# Patient Record
Sex: Female | Born: 2000 | Race: White | Hispanic: No | Marital: Single | Smoking: Never smoker
Health system: Southern US, Community
[De-identification: ages and names within clinical notes are randomized; demographics above are authoritative.]

## PROBLEM LIST (undated history)

## (undated) DIAGNOSIS — R55 Syncope and collapse: Secondary | ICD-10-CM

## (undated) DIAGNOSIS — F410 Panic disorder [episodic paroxysmal anxiety] without agoraphobia: Secondary | ICD-10-CM

## (undated) DIAGNOSIS — J939 Pneumothorax, unspecified: Secondary | ICD-10-CM

## (undated) DIAGNOSIS — K529 Noninfective gastroenteritis and colitis, unspecified: Secondary | ICD-10-CM

## (undated) DIAGNOSIS — S060XAA Concussion with loss of consciousness status unknown, initial encounter: Secondary | ICD-10-CM

## (undated) DIAGNOSIS — S060X9A Concussion with loss of consciousness of unspecified duration, initial encounter: Secondary | ICD-10-CM

## (undated) HISTORY — PX: ESOPHAGOGASTRODUODENOSCOPY: SHX1529

---
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
## 2018-02-07 ENCOUNTER — Encounter: Payer: Self-pay | Admitting: Emergency Medicine

## 2018-02-07 ENCOUNTER — Ambulatory Visit
Admission: EM | Admit: 2018-02-07 | Discharge: 2018-02-07 | Disposition: A | Discharge status: Home / Self Care | Payer: Self-pay | Attending: Emergency Medicine | Admitting: Emergency Medicine

## 2018-02-07 ENCOUNTER — Other Ambulatory Visit: Payer: Self-pay

## 2018-02-07 ENCOUNTER — Ambulatory Visit (INDEPENDENT_AMBULATORY_CARE_PROVIDER_SITE_OTHER): Payer: Self-pay

## 2018-02-07 DIAGNOSIS — S60211A Contusion of right wrist, initial encounter: Secondary | ICD-10-CM

## 2018-02-07 DIAGNOSIS — M25531 Pain in right wrist: Secondary | ICD-10-CM

## 2018-02-07 DIAGNOSIS — W2209XA Striking against other stationary object, initial encounter: Secondary | ICD-10-CM

## 2018-02-07 NOTE — ED Provider Notes (Signed)
[facility]    CSN: [id number] Arrival date & time: 02/07/18  1350     History   Chief Complaint Chief Complaint  Patient presents with  . Wrist Pain    right    HPI Sharon Vaughn [address]ain [name] is a 17 y.o. female.   Patient is a 17 year old female who presents complaining of right arm pain.  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  Patient reports pain still present this morning with a little bit of swelling.  Increased pain with movement and touching it.  She states she did ice it for about 50 minutes last night but nothing since.  She has taken no medications.  Patient does have good movement and sensation distally.  She does report some pain with making a fist.     Past Medical History:  Diagnosis Date  . Colitis   . Concussion   . Pneumothorax   . Syncope     There are no active problems to display for this patient.   History reviewed. No pertinent surgical history.  OB History   None      Home Medications    Prior to Admission medications   Medication Sig Start Date End Date Taking? Authorizing Provider  hydrOXYzine (ATARAX/VISTARIL) 25 MG tablet Take 1 tablet (25 mg total) by mouth every 8 (eight) hours as needed. 11/07/17   Payton Mccallumonty, [place], MD  ondansetron (ZOFRAN ODT) 4 MG disintegrating tablet Take 1 tablet (4 mg total) by mouth every 6 (six) hours as needed for nausea or vomiting. 09/07/16   Sharyn CreamerQuale, Mark, MD    Family History Family History  Adopted: Yes    Social History Social History   Tobacco Use  . Smoking status: Never Smoker  . Smokeless tobacco: Never Used  Substance Use Topics  . Alcohol use: Not Currently  . Drug use: Yes    Types: Marijuana    Comment: Last use a month ago     Allergies   Patient has no known allergies.   Review of Systems Review of Systems as noted above HPI.  Other system  reviewed and found to be negative   Physical Exam Triage Vital Signs ED Triage Vitals  Enc Vitals Group     BP 02/07/18 1424 112/69     Pulse Rate 02/07/18 1424 66     Resp 02/07/18 1424 14     Temp 02/07/18 1424 98.2 F (36.8 C)     Temp Source 02/07/18 1424 Oral     SpO2 02/07/18 1424 100 %     Weight 02/07/18 1422 130 lb (59 kg)     Height 02/07/18 1422 5\' 5"  (1.651 m)     Head Circumference --      Peak Flow --      Pain Score 02/07/18 1422 7     Pain Loc --      Pain Edu? --      Excl. in GC? --    No data found.  Updated Vital Signs BP 112/69 (BP Location: Left Arm)   Pulse 66   Temp 98.2 F (36.8 C) (Oral)   Resp 14   Ht 5\' 5"  (1.651 m)   Wt 130 lb (59 kg)   LMP 01/17/2018 (Approximate)   SpO2 100%   BMI 21.63 kg/m    Physical Exam  Constitutional: She appears  well-developed and well-nourished. No distress.  Eyes: EOM are normal.  Pulmonary/Chest: Effort normal.  Musculoskeletal:       Right wrist: She exhibits tenderness.       Arms: Patient with distal pulse and sensation intact.  Fine motor function intact.  Patient able to make a fist but is painful.     UC Treatments / Results  Labs (all labs ordered are listed, but only abnormal results are displayed) Labs Reviewed - No data to display  EKG None  Radiology Dg Forearm Right  Result Date: 02/07/2018 CLINICAL DATA:  Blunt trauma to wrist yesterday, initial encounter EXAM: RIGHT FOREARM - 2 VIEW COMPARISON:  None. FINDINGS: There is no evidence of fracture or other focal bone lesions. Soft tissues are unremarkable. IMPRESSION: No acute abnormality noted. Electronically Signed   By: Alcide Clever M.D.   On: 02/07/2018 14:56    Procedures Procedures (including critical care time)  Medications Ordered in UC Medications - No data to display  Initial Impression / Assessment and Plan / UC Course  I have reviewed the triage vital signs and the nursing notes.  Pertinent labs & imaging results  that were available during my care of the patient were reviewed by me and considered in my medical decision making (see chart for details).     Patient with right forearm/wrist tenderness after hitting it on a counter/shelf at work last night.  X-ray is negative for any fracture.  Patient does have some tenderness palpation in the area.  She has fine motor function intact distally as well as circulation and sensation.  She is able to make a fist but is painful.  We will give her instructions for R ICE.  Patient has a splint for her wrist from previous injury and will have her wear that as able.  Recommend ibuprofen and or Tylenol as needed for pain.  Final Clinical Impressions(s) / UC Diagnoses   Final diagnoses:  Contusion of right wrist, initial encounter     Discharge Instructions     -wear wrist splint as much as able -Rest Ice Compression Elevation: see attached instructions -Ibuprofen or Tylenol for pain -Return to clinic should pain not improve   ** Pt has splint from previous injury.  ED Prescriptions    None     Controlled Substance Prescriptions [place] Controlled Substance Registry consulted? Not Applicable   Candis Schatz, PA-C 02/07/18 1609

## 2018-02-07 NOTE — Discharge Instructions (Addendum)
-  wear wrist splint as much as able -Rest Ice Compression Elevation: see attached instructions -Ibuprofen or Tylenol for pain -Return to clinic should pain not improve

## 2018-02-07 NOTE — ED Triage Notes (Signed)
Patient states that she slammed her right wrist on the counter at work last night.  Patient c/o pain in her wrist and forearm.

## 2018-06-15 ENCOUNTER — Other Ambulatory Visit: Payer: Self-pay

## 2018-06-15 ENCOUNTER — Encounter: Payer: Self-pay | Admitting: Gynecology

## 2018-06-15 ENCOUNTER — Ambulatory Visit
Admission: EM | Admit: 2018-06-15 | Discharge: 2018-06-15 | Disposition: A | Discharge status: Home / Self Care | Payer: Self-pay | Attending: Family Medicine | Admitting: Family Medicine

## 2018-06-15 DIAGNOSIS — N390 Urinary tract infection, site not specified: Secondary | ICD-10-CM

## 2018-06-15 DIAGNOSIS — R319 Hematuria, unspecified: Secondary | ICD-10-CM

## 2018-06-15 DIAGNOSIS — B9689 Other specified bacterial agents as the cause of diseases classified elsewhere: Secondary | ICD-10-CM

## 2018-06-15 HISTORY — DX: Panic disorder (episodic paroxysmal anxiety): F41.0

## 2018-06-15 LAB — URINALYSIS, COMPLETE (UACMP) WITH MICROSCOPIC
Bilirubin Urine: NEGATIVE
GLUCOSE, UA: NEGATIVE mg/dL
Ketones, ur: NEGATIVE mg/dL
Leukocytes, UA: NEGATIVE
NITRITE: NEGATIVE
PH: 5 (ref 5.0–8.0)
Protein, ur: NEGATIVE mg/dL
Specific Gravity, Urine: >1.03 — ABNORMAL HIGH (ref 1.005–1.030)

## 2018-06-15 MED ORDER — CEPHALEXIN 500 MG PO CAPS: 500.0000 mg | ORAL_CAPSULE | Freq: Two times a day (BID) | ORAL | 0 refills | Status: DC

## 2018-06-15 NOTE — ED Provider Notes (Signed)
[facility]    CSN: [id number] Arrival date & time: 06/15/18  1541     History   Chief Complaint No chief complaint on file.   HPI [address] Sharon Vaughn is a 17 y.o. female.   The history is provided by the patient.  Dysuria  Pain quality:  Unable to specify Pain severity:  Mild Onset quality:  Sudden Duration:  3 days Timing:  Constant Progression:  Unchanged Chronicity:  New Recent urinary tract infections: no   Relieved by:  None tried Ineffective treatments:  None tried Urinary symptoms: frequent urination   Associated symptoms: no abdominal pain, no fever, no flank pain, no nausea and no vomiting   Risk factors: no hx of pyelonephritis, no hx of urolithiasis, no kidney transplant, not pregnant, no recurrent urinary tract infections, no renal cysts, no renal disease, no single kidney and no urinary catheter     Past Medical History:  Diagnosis Date  . Colitis   . Concussion   . Panic attack   . Pneumothorax   . Syncope     There are no active problems to display for this patient.   History reviewed. No pertinent surgical history.  OB History   None      Home Medications    Prior to Admission medications   Medication Sig Start Date End Date Taking? Authorizing Provider  hydrOXYzine (ATARAX/VISTARIL) 25 MG tablet Take 1 tablet (25 mg total) by mouth every 8 (eight) hours as needed. 11/07/17  Yes Payton Mccallum, MD  cephALEXin (KEFLEX) 500 MG capsule Take 1 capsule (500 mg total) by mouth 2 (two) times daily. 06/15/18   Payton Mccallum, MD  ondansetron (ZOFRAN ODT) 4 MG disintegrating tablet Take 1 tablet (4 mg total) by mouth every 6 (six) hours as needed for nausea or vomiting. 09/07/16   Sharyn Creamer, MD    Family History Family History  Adopted: Yes    Social History Social History   Tobacco Use  . Smoking status: Never Smoker  . Smokeless tobacco: Never Used  Substance Use Topics  . Alcohol use: Not Currently  . Drug use: Yes   Types: Marijuana    Comment: Last use a month ago     Allergies   Patient has no known allergies.   Review of Systems Review of Systems  Constitutional: Negative for fever.  Gastrointestinal: Negative for abdominal pain, nausea and vomiting.  Genitourinary: Positive for dysuria. Negative for flank pain.     Physical Exam Triage Vital Signs ED Triage Vitals  Enc Vitals Group     BP 06/15/18 1622 112/66     Pulse Rate 06/15/18 1622 61     Resp 06/15/18 1622 16     Temp 06/15/18 1622 98.4 F (36.9 C)     Temp Source 06/15/18 1622 Oral     SpO2 06/15/18 1622 100 %     Weight 06/15/18 1624 135 lb (61.2 kg)     Height 06/15/18 1624 5\' 4"  (1.626 m)     Head Circumference --      Peak Flow --      Pain Score 06/15/18 1624 6     Pain Loc --      Pain Edu? --      Excl. in GC? --    No data found.  Updated Vital Signs BP 112/66 (BP Location: Left Arm)   Pulse 61   Temp 98.4 F (36.9 C) (Oral)   Resp 16   Ht 5\' 4"  (1.626  m)   Wt 61.2 kg   LMP 06/10/2018   SpO2 100%   BMI 23.17 kg/m   Visual Acuity Right Eye Distance:   Left Eye Distance:   Bilateral Distance:    Right Eye Near:   Left Eye Near:    Bilateral Near:     Physical Exam  Constitutional: She appears well-developed and well-nourished. No distress.  Abdominal: Soft. Bowel sounds are normal. She exhibits no distension and no mass. There is no tenderness. There is no rebound and no guarding.  Skin: She is not diaphoretic.  Nursing note and vitals reviewed.    UC Treatments / Results  Labs (all labs ordered are listed, but only abnormal results are displayed) Labs Reviewed  URINALYSIS, COMPLETE (UACMP) WITH MICROSCOPIC - Abnormal; Notable for the following components:      Result Value   APPearance HAZY (*)    Specific Gravity, Urine >1.030 (*)    Hgb urine dipstick TRACE (*)    Bacteria, UA MANY (*)    All other components within normal limits  URINE CULTURE    EKG None  Radiology No  results found.  Procedures Procedures (including critical care time)  Medications Ordered in UC Medications - No data to display  Initial Impression / Assessment and Plan / UC Course  I have reviewed the triage vital signs and the nursing notes.  Pertinent labs & imaging results that were available during my care of the patient were reviewed by me and considered in my medical decision making (see chart for details).      Final Clinical Impressions(s) / UC Diagnoses   Final diagnoses:  Urinary tract infection with hematuria, site unspecified    ED Prescriptions    Medication Sig Dispense Auth. Provider   cephALEXin (KEFLEX) 500 MG capsule Take 1 capsule (500 mg total) by mouth 2 (two) times daily. 14 capsule Payton Mccallumonty, Lezly Rumpf, MD     1. Lab results and diagnosis reviewed with patient 2. rx as per orders above; reviewed possible side effects, interactions, risks and benefits  3. Recommend supportive treatment with increased water intake 4. Follow-up prn if symptoms worsen or don't improve   Controlled Substance Prescriptions [place] Controlled Substance Registry consulted? Not Applicable   Payton Mccallumonty, Shadrach Bartunek, MD 06/15/18 [phone]

## 2018-06-15 NOTE — ED Triage Notes (Signed)
Patient c/o urine frequency

## 2018-06-18 LAB — URINE CULTURE: Culture: 70000 — AB

## 2018-06-20 ENCOUNTER — Telehealth (HOSPITAL_COMMUNITY): Payer: Self-pay | Admitting: Emergency Medicine

## 2018-06-20 NOTE — Telephone Encounter (Signed)
Urine culture was positive for KLEBSIELLA PNEUMONIAE and was given keflex  at urgent care visit. Patient mother answered and stated "shes doing fine". Will have her call back if she has any questions.

## 2018-09-12 ENCOUNTER — Ambulatory Visit
Admission: EM | Admit: 2018-09-12 | Discharge: 2018-09-12 | Disposition: A | Discharge status: Home / Self Care | Payer: No Typology Code available for payment source | Attending: Family Medicine | Admitting: Family Medicine

## 2018-09-12 ENCOUNTER — Other Ambulatory Visit: Payer: Self-pay

## 2018-09-12 DIAGNOSIS — R110 Nausea: Secondary | ICD-10-CM | POA: Diagnosis not present

## 2018-09-12 DIAGNOSIS — R42 Dizziness and giddiness: Secondary | ICD-10-CM

## 2018-09-12 MED ORDER — ONDANSETRON 8 MG PO TBDP: 8.0000 mg | ORAL_TABLET | Freq: Two times a day (BID) | ORAL | 0 refills | Status: DC

## 2018-09-12 MED ORDER — MECLIZINE HCL 12.5 MG PO TABS: 12.5000 mg | ORAL_TABLET | Freq: Three times a day (TID) | ORAL | 0 refills | Status: DC | PRN

## 2018-09-12 NOTE — Discharge Instructions (Addendum)
Plenty of fluids.  Try to keep your urine as clear as water or pale yellow .

## 2018-09-12 NOTE — ED Triage Notes (Signed)
Patient complains of dizziness that started last night. Patient states that she feels like she is spinning at times. Patient states that symptoms came back around 130pm this afternoon and she has noticed some nausea as well.

## 2018-09-12 NOTE — ED Provider Notes (Addendum)
[facility]    CSN: [id number] Arrival date & time: 09/12/18  1431     History   Chief Complaint Chief Complaint  Patient presents with  . Dizziness    HPI [address] Livingstone is a 18 y.o. female.   HPI  18 year old female presents with dizziness that started last night.  Patient at times she feels like things are spinning but this is very intermittent.  Had nausea but no vomiting.  Felt somewhat better until about 130 this afternoon when she again had the dizziness and nausea.  Now she mostly has a frontal headache.  She took Aleve this morning for the headache and it seemed to alleviate it.  She said no neurological symptoms.         Past Medical History:  Diagnosis Date  . Colitis   . Concussion   . Panic attack   . Pneumothorax   . Syncope     There are no active problems to display for this patient.   Past Surgical History:  Procedure Laterality Date  . ESOPHAGOGASTRODUODENOSCOPY      OB History   No obstetric history on file.      Home Medications    Prior to Admission medications   Medication Sig Start Date End Date Taking? Authorizing Provider  meclizine (ANTIVERT) 12.5 MG tablet Take 1 tablet (12.5 mg total) by mouth 3 (three) times daily as needed for dizziness. 09/12/18   Lutricia Feil, PA-C  ondansetron (ZOFRAN ODT) 8 MG disintegrating tablet Take 1 tablet (8 mg total) by mouth 2 (two) times daily. 09/12/18   Lutricia Feil, PA-C    Family History Family History  Adopted: Yes    Social History Social History   Tobacco Use  . Smoking status: Never Smoker  . Smokeless tobacco: Never Used  Substance Use Topics  . Alcohol use: Not Currently  . Drug use: Yes    Types: Marijuana    Comment: Last use a month ago     Allergies   Patient has no known allergies.   Review of Systems Review of Systems  Constitutional: Positive for activity change and appetite change. Negative for chills, fatigue and fever.  Neurological:  Positive for dizziness and headaches. Negative for seizures, syncope, speech difficulty, weakness and numbness.  All other systems reviewed and are negative.    Physical Exam Triage Vital Signs ED Triage Vitals  Enc Vitals Group     BP 09/12/18 1502 125/71     Pulse Rate 09/12/18 1502 76     Resp 09/12/18 1502 16     Temp 09/12/18 1502 98.1 F (36.7 C)     Temp Source 09/12/18 1502 Oral     SpO2 09/12/18 1502 100 %     Weight 09/12/18 1503 135 lb (61.2 kg)     Height 09/12/18 1503  (1.626 m)     Head Circumference --      Peak Flow --      Pain Score 09/12/18 1502 4     Pain Loc --      Pain Edu? --      Excl. in GC? --    Orthostatic VS for the past 24 hrs:  BP- Lying Pulse- Lying BP- Sitting Pulse- Sitting BP- Standing at 0 minutes Pulse- Standing at 0 minutes  09/12/18 1529 116/66 62 119/66 71 116/79 75    Updated Vital Signs BP 125/71 (BP Location: Left Arm)   Pulse 76   Temp 98.1  F (36.7 C) (Oral)   Resp 16   Ht 5\' 4"  (1.626 m)   Wt 135 lb (61.2 kg)   LMP 08/23/2018   SpO2 100%   BMI 23.17 kg/m   Visual Acuity Right Eye Distance:   Left Eye Distance:   Bilateral Distance:    Right Eye Near:   Left Eye Near:    Bilateral Near:     Physical Exam Vitals signs and nursing note reviewed.  Constitutional:      General: She is not in acute distress.    Appearance: Normal appearance. She is normal weight. She is not ill-appearing, toxic-appearing or diaphoretic.  HENT:     Head: Normocephalic and atraumatic.     Right Ear: Tympanic membrane, ear canal and external ear normal. There is no impacted cerumen.     Left Ear: Tympanic membrane, ear canal and external ear normal. There is no impacted cerumen.     Nose: Nose normal. No congestion or rhinorrhea.     Mouth/Throat:     Mouth: Mucous membranes are moist.     Pharynx: Oropharynx is clear. No oropharyngeal exudate or posterior oropharyngeal erythema.  Eyes:     General:        Right eye: No  discharge.        Left eye: No discharge.     Conjunctiva/sclera: Conjunctivae normal.  Neck:     Musculoskeletal: Normal range of motion and neck supple.  Musculoskeletal: Normal range of motion.  Skin:    General: Skin is warm and dry.  Neurological:     General: No focal deficit present.     Mental Status: She is alert and oriented to person, place, and time. Mental status is at baseline.     Cranial Nerves: No cranial nerve deficit.     Sensory: No sensory deficit.     Motor: No weakness.     Coordination: Coordination normal.     Gait: Gait normal.     Deep Tendon Reflexes: Reflexes normal.  Psychiatric:        Mood and Affect: Mood normal.        Behavior: Behavior normal.        Thought Content: Thought content normal.        Judgment: Judgment normal.      UC Treatments / Results  Labs (all labs ordered are listed, but only abnormal results are displayed) Labs Reviewed - No data to display  EKG None  Radiology No results found.  Procedures Procedures (including critical care time)  Medications Ordered in UC Medications - No data to display  Initial Impression / Assessment and Plan / UC Course  I have reviewed the triage vital signs and the nursing notes.  Pertinent labs & imaging results that were available during my care of the patient were reviewed by me and considered in my medical decision making (see chart for details).   Patient has normal orthostatic vital signs today but I suspect that she does have slight dryness.  Not have vertigo.  Her symptoms are very vague and intermittent.  I will treat her with Zofran for her nausea.  I have asked her to increase her fluids and to observe her urine to keep it clear as water or only a pale yellow at darkest.  She worsens she may return to our clinic.  Neurological examination was also very reassuring today.   Final Clinical Impressions(s) / UC Diagnoses   Final diagnoses:  Dizziness  Nausea without  vomiting     Discharge Instructions     Plenty of fluids.  Try to keep your urine as clear as water or pale yellow .    ED Prescriptions    Medication Sig Dispense Auth. Provider   ondansetron (ZOFRAN ODT) 8 MG disintegrating tablet Take 1 tablet (8 mg total) by mouth 2 (two) times daily. 6 tablet Lutricia Feil, PA-C   meclizine (ANTIVERT) 12.5 MG tablet Take 1 tablet (12.5 mg total) by mouth 3 (three) times daily as needed for dizziness. 15 tablet Lutricia Feil, PA-C     Controlled Substance Prescriptions [place] Controlled Substance Registry consulted? Not Applicable   Lutricia Feil, PA-C 09/12/18 1710    Lutricia Feil, [place] 09/12/18 1711

## 2018-10-05 ENCOUNTER — Encounter: Payer: Self-pay | Admitting: Gynecology

## 2018-10-05 ENCOUNTER — Ambulatory Visit
Admission: EM | Admit: 2018-10-05 | Discharge: 2018-10-05 | Disposition: A | Discharge status: Home / Self Care | Payer: No Typology Code available for payment source | Attending: Emergency Medicine | Admitting: Emergency Medicine

## 2018-10-05 ENCOUNTER — Other Ambulatory Visit: Payer: Self-pay

## 2018-10-05 DIAGNOSIS — R300 Dysuria: Secondary | ICD-10-CM

## 2018-10-05 DIAGNOSIS — Z3202 Encounter for pregnancy test, result negative: Secondary | ICD-10-CM | POA: Diagnosis not present

## 2018-10-05 DIAGNOSIS — R350 Frequency of micturition: Secondary | ICD-10-CM

## 2018-10-05 LAB — URINALYSIS, COMPLETE (UACMP) WITH MICROSCOPIC
Bacteria, UA: NONE SEEN
Glucose, UA: NEGATIVE mg/dL
Ketones, ur: NEGATIVE mg/dL
NITRITE: NEGATIVE
Specific Gravity, Urine: >1.03 — ABNORMAL HIGH (ref 1.005–1.030)
pH: 5 (ref 5.0–8.0)

## 2018-10-05 LAB — PREGNANCY, URINE: Preg Test, Ur: NEGATIVE

## 2018-10-05 MED ORDER — FLUCONAZOLE 200 MG PO TABS: ORAL_TABLET | ORAL | 0 refills | Status: DC

## 2018-10-05 MED ORDER — NITROFURANTOIN MONOHYD MACRO 100 MG PO CAPS: 100.0000 mg | ORAL_CAPSULE | Freq: Two times a day (BID) | ORAL | 0 refills | Status: AC

## 2018-10-05 NOTE — Discharge Instructions (Signed)
Recommend start Macrobid 100mg  twice a day as directed. Start Diflucan 200mg  one tablet today, repeat 1 tablet in 2 days and final tablet in 5 days. Continue to increase fluids. Follow-up pending urine culture results and in 3 days if not improving.

## 2018-10-05 NOTE — ED Provider Notes (Signed)
[facility]    CSN: [id number] Arrival date & time: 10/05/18  1349     History   Chief Complaint No chief complaint on file.   HPI [address] [place] is a 17 y.o. female.   18 year old female presents with pain with urination and increased urinary frequency for the past 2 to 3 days. Noticed slight blood in her urine today. Denies any fever, back pain, nausea, vomiting or unusual vaginal discharge. Has not taken any medication for symptoms. Had her first UTI about 3 months ago (Dec 2019), was evaluated here at Urgent Care and her culture grew out Klebsiella. She was placed on Keflex with resolution of symptoms. Symptoms feel similar to previous episode but more constant pain. No other chronic health issues. Takes no daily medication.    The history is provided by the patient.    Past Medical History:  Diagnosis Date  . Colitis   . Concussion   . Panic attack   . Pneumothorax   . Syncope     There are no active problems to display for this patient.   Past Surgical History:  Procedure Laterality Date  . ESOPHAGOGASTRODUODENOSCOPY      OB History   No obstetric history on file.      Home Medications    Prior to Admission medications   Medication Sig Start Date End Date Taking? Authorizing Provider  fluconazole (DIFLUCAN) 200 MG tablet Take 1 tablet by mouth today, then repeat 1 tablet in 2 days and final tablet in 5 days. 10/05/18   Sudie Grumbling, NP  nitrofurantoin, macrocrystal-monohydrate, (MACROBID) 100 MG capsule Take 1 capsule (100 mg total) by mouth 2 (two) times daily for 5 days. 10/05/18 10/10/18  Sudie Grumbling, NP    Family History Family History  Adopted: Yes    Social History Social History   Tobacco Use  . Smoking status: Never Smoker  . Smokeless tobacco: Never Used  Substance Use Topics  . Alcohol use: Not Currently  . Drug use: Yes    Types: Marijuana    Comment: Last use a month ago     Allergies   Patient has no known  allergies.   Review of Systems Review of Systems  Constitutional: Negative for activity change, appetite change, chills, fatigue and fever.  HENT: Negative for congestion, mouth sores and sore throat.   Respiratory: Negative for cough, chest tightness, shortness of breath and wheezing.   Gastrointestinal: Negative for abdominal pain, diarrhea, nausea and vomiting.  Genitourinary: Positive for dysuria, frequency and hematuria. Negative for decreased urine volume, difficulty urinating, flank pain, genital sores, pelvic pain, vaginal bleeding, vaginal discharge and vaginal pain.  Musculoskeletal: Negative for arthralgias, back pain and myalgias.  Skin: Negative for color change, rash and wound.  Allergic/Immunologic: Negative for immunocompromised state.  Neurological: Negative for dizziness, tremors, seizures, syncope, weakness, light-headedness, numbness and headaches.  Hematological: Negative for adenopathy. Does not bruise/bleed easily.     Physical Exam Triage Vital Signs ED Triage Vitals  Enc Vitals Group     BP 10/05/18 1509 116/77     Pulse Rate 10/05/18 1509 75     Resp 10/05/18 1509 16     Temp 10/05/18 1509 98 F (36.7 C)     Temp Source 10/05/18 1509 Oral     SpO2 10/05/18 1509 100 %     Weight 10/05/18 1510 130 lb (59 kg)     Height --      Head Circumference --  Peak Flow --      Pain Score 10/05/18 1509 7     Pain Loc --      Pain Edu? --      Excl. in GC? --    No data found.  Updated Vital Signs BP 116/77 (BP Location: Left Arm)   Pulse 75   Temp 98 F (36.7 C) (Oral)   Resp 16   Wt 130 lb (59 kg)   LMP 09/02/2017   SpO2 100%   BMI 22.31 kg/m   Visual Acuity Right Eye Distance:   Left Eye Distance:   Bilateral Distance:    Right Eye Near:   Left Eye Near:    Bilateral Near:     Physical Exam Vitals signs and nursing note reviewed.  Constitutional:      General: She is awake. She is not in acute distress.    Appearance: Normal  appearance. She is well-developed and well-groomed. She is not ill-appearing.     Comments: Patient sitting comfortably on exam table in no acute distress.   HENT:     Head: Normocephalic and atraumatic.     Right Ear: Hearing normal.     Left Ear: Hearing normal.  Eyes:     Extraocular Movements: Extraocular movements intact.     Conjunctiva/sclera: Conjunctivae normal.  Neck:     Musculoskeletal: Normal range of motion.  Cardiovascular:     Rate and Rhythm: Normal rate and regular rhythm.     Heart sounds: Normal heart sounds. No murmur.  Pulmonary:     Effort: Pulmonary effort is normal. No respiratory distress.     Breath sounds: Normal breath sounds and air entry. No stridor. No decreased breath sounds, wheezing, rhonchi or rales.  Abdominal:     General: Abdomen is flat. Bowel sounds are normal.     Palpations: Abdomen is soft.     Tenderness: There is abdominal tenderness in the suprapubic area. There is no right CVA tenderness, left CVA tenderness, guarding or rebound.  Musculoskeletal: Normal range of motion.  Skin:    General: Skin is warm and dry.     Findings: No rash.  Neurological:     General: No focal deficit present.     Mental Status: She is alert and oriented to person, place, and time.  Psychiatric:        Mood and Affect: Mood normal.        Behavior: Behavior normal. Behavior is cooperative.        Thought Content: Thought content normal.        Judgment: Judgment normal.      UC Treatments / Results  Labs (all labs ordered are listed, but only abnormal results are displayed) Labs Reviewed  URINALYSIS, COMPLETE (UACMP) WITH MICROSCOPIC - Abnormal; Notable for the following components:      Result Value   APPearance CLOUDY (*)    Specific Gravity, Urine >1.030 (*)    Hgb urine dipstick LARGE (*)    Bilirubin Urine SMALL (*)    Protein, ur >300 (*)    Leukocytes,Ua SMALL (*)    All other components within normal limits  URINE CULTURE  PREGNANCY,  URINE    EKG None  Radiology No results found.  Procedures Procedures (including critical care time)  Medications Ordered in UC Medications - No data to display  Initial Impression / Assessment and Plan / UC Course  I have reviewed the triage vital signs and the nursing notes.  Pertinent labs &  imaging results that were available during my care of the patient were reviewed by me and considered in my medical decision making (see chart for details).    Reviewed negative urine pregnancy test result and urinalysis results with patient- blood, protein and WBC's seen with some budding yeast- probable UTI- may also have yeast infection. Sent urine for culture. Recommend start Macrobid 100mg  twice a day as directed. May start Diflucan 200mg  today and every other day for the next 5 days (total of 3 doses). Encouraged to push fluids. Follow-up pending urine culture results and in 3 days if not improving.   Final Clinical Impressions(s) / UC Diagnoses   Final diagnoses:  Dysuria  Urinary frequency     Discharge Instructions     Recommend start Macrobid 100mg  twice a day as directed. Start Diflucan 200mg  one tablet today, repeat 1 tablet in 2 days and final tablet in 5 days. Continue to increase fluids. Follow-up pending urine culture results and in 3 days if not improving.     ED Prescriptions    Medication Sig Dispense Auth. Provider   nitrofurantoin, macrocrystal-monohydrate, (MACROBID) 100 MG capsule Take 1 capsule (100 mg total) by mouth 2 (two) times daily for 5 days. 10 capsule Sudie Grumbling, NP   fluconazole (DIFLUCAN) 200 MG tablet Take 1 tablet by mouth today, then repeat 1 tablet in 2 days and final tablet in 5 days. 3 tablet Sudie Grumbling, NP     Controlled Substance Prescriptions [place] Controlled Substance Registry consulted? Not Applicable   Sudie Grumbling, NP 10/05/18 2209

## 2018-10-05 NOTE — ED Triage Notes (Signed)
Patient c/o pain and discomfort with urination. Per patient frequency urination x 2-3 days.

## 2018-10-07 LAB — URINE CULTURE

## 2018-12-26 ENCOUNTER — Encounter: Payer: Self-pay | Admitting: Emergency Medicine

## 2018-12-26 ENCOUNTER — Ambulatory Visit
Admission: EM | Admit: 2018-12-26 | Discharge: 2018-12-26 | Disposition: A | Discharge status: Home / Self Care | Payer: No Typology Code available for payment source | Attending: Family Medicine | Admitting: Family Medicine

## 2018-12-26 ENCOUNTER — Other Ambulatory Visit: Payer: Self-pay

## 2018-12-26 DIAGNOSIS — R0602 Shortness of breath: Secondary | ICD-10-CM

## 2018-12-26 DIAGNOSIS — R0789 Other chest pain: Secondary | ICD-10-CM | POA: Diagnosis not present

## 2018-12-26 MED ORDER — DICLOFENAC SODIUM 75 MG PO TBEC: 75.0000 mg | DELAYED_RELEASE_TABLET | Freq: Two times a day (BID) | ORAL | 0 refills | Status: DC | PRN

## 2018-12-26 NOTE — ED Triage Notes (Signed)
Patient c/o chest pain off and on for a week.  Patient reports that the SOB started today.  Patient denies fevers. Patient denies cough.

## 2018-12-26 NOTE — ED Provider Notes (Signed)
[facility]    CSN: [id number] Arrival date & time: 12/26/18  1234  History   Chief Complaint Chief Complaint  Patient presents with  . Chest Pain  . Shortness of Breath   HPI  18 year old female presents with the above complaints.  Patient reports a 1.5-week history of intermittent chest pain.  She had associated shortness of breath today.  Her pain is located on the left side of her chest had a discrete area of tenderness.  No fever.  No cough.  No medications or interventions tried.  Patient has a history of anxiety.  No known exacerbating factors.  No other associated symptoms.  No other complaints.  PMH, Surgical Hx, Family Hx, Social History reviewed and updated as below.  Past Medical History:  Diagnosis Date  . Colitis   . Concussion   . Panic attack   . Pneumothorax   . Syncope    Past Surgical History:  Procedure Laterality Date  . ESOPHAGOGASTRODUODENOSCOPY      OB History   No obstetric history on file.      Home Medications    Prior to Admission medications   Medication Sig Start Date End Date Taking? Authorizing Provider  diclofenac (VOLTAREN) 75 MG EC tablet Take 1 tablet (75 mg total) by mouth 2 (two) times daily as needed for moderate pain. 12/26/18   Tommie Samsook, Lindi Abram G, DO    Family History Family History  Adopted: Yes    Social History Social History   Tobacco Use  . Smoking status: Never Smoker  . Smokeless tobacco: Never Used  Substance Use Topics  . Alcohol use: Not Currently  . Drug use: Not Currently    Types: Marijuana    Comment: Last use a month ago     Allergies   Patient has no known allergies.   Review of Systems Review of Systems  Respiratory: Positive for shortness of breath.   Cardiovascular: Positive for chest pain.   Physical Exam Triage Vital Signs ED Triage Vitals  Enc Vitals Group     BP 12/26/18 1248 108/79     Pulse Rate 12/26/18 1248 66     Resp 12/26/18 1248 14     Temp 12/26/18 1248 98.5  F (36.9 C)     Temp Source 12/26/18 1248 Oral     SpO2 12/26/18 1248 100 %     Weight 12/26/18 1245 120 lb (54.4 kg)     Height 12/26/18 1245 5\' 4"  (1.626 m)     Head Circumference --      Peak Flow --      Pain Score 12/26/18 1244 0     Pain Loc --      Pain Edu? --      Excl. in GC? --    Updated Vital Signs BP 108/79 (BP Location: Left Arm)   Pulse 66   Temp 98.5 F (36.9 C) (Oral)   Resp 14   Ht 5\' 4"  (1.626 m)   Wt 54.4 kg   LMP 12/12/2018 (Approximate)   SpO2 100%   BMI 20.60 kg/m   Visual Acuity Right Eye Distance:   Left Eye Distance:   Bilateral Distance:    Right Eye Near:   Left Eye Near:    Bilateral Near:     Physical Exam Constitutional:      General: She is not in acute distress.    Appearance: Normal appearance.  HENT:     Head: Normocephalic and atraumatic.  Eyes:  General:        Right eye: No discharge.        Left eye: No discharge.     Conjunctiva/sclera: Conjunctivae normal.  Cardiovascular:     Rate and Rhythm: Normal rate.  Pulmonary:     Effort: Pulmonary effort is normal.     Breath sounds: Normal breath sounds.  Chest:     Chest wall: Tenderness present.  Neurological:     Mental Status: She is alert.  Psychiatric:        Mood and Affect: Mood normal.        Behavior: Behavior normal.    UC Treatments / Results  Labs (all labs ordered are listed, but only abnormal results are displayed) Labs Reviewed - No data to display  EKG None  Radiology No results found.  Procedures Procedures (including critical care time)  Medications Ordered in UC Medications - No data to display  Initial Impression / Assessment and Plan / UC Course  I have reviewed the triage vital signs and the nursing notes.  Pertinent labs & imaging results that were available during my care of the patient were reviewed by me and considered in my medical decision making (see chart for details).    18 year old female presents with chest wall  pain.  Diclofenac as needed.  Supportive care.  Final Clinical Impressions(s) / UC Diagnoses   Final diagnoses:  Chest wall pain   Discharge Instructions   None    ED Prescriptions    Medication Sig Dispense Auth. Provider   diclofenac (VOLTAREN) 75 MG EC tablet Take 1 tablet (75 mg total) by mouth 2 (two) times daily as needed for moderate pain. 30 tablet Coral Spikes, DO     Controlled Substance Prescriptions [place] Controlled Substance Registry consulted? Not Applicable   Coral Spikes, DO 12/26/18 1509

## 2019-03-05 ENCOUNTER — Other Ambulatory Visit: Payer: Self-pay

## 2019-03-05 ENCOUNTER — Ambulatory Visit
Admission: EM | Admit: 2019-03-05 | Discharge: 2019-03-05 | Disposition: A | Discharge status: Home / Self Care | Payer: No Typology Code available for payment source | Attending: Family Medicine | Admitting: Family Medicine

## 2019-03-05 ENCOUNTER — Encounter: Payer: Self-pay | Admitting: Emergency Medicine

## 2019-03-05 DIAGNOSIS — R0602 Shortness of breath: Secondary | ICD-10-CM

## 2019-03-05 DIAGNOSIS — R51 Headache: Secondary | ICD-10-CM | POA: Diagnosis not present

## 2019-03-05 NOTE — ED Provider Notes (Signed)
[facility]    CSN: [id number] Arrival date & time: 03/05/19  1222  History   Chief Complaint Chief Complaint  Patient presents with  . Shortness of Breath  . Headache   HPI  18 year old female presents with shortness of breath.  Patient reports that her symptoms started yesterday.  She reports shortness of breath particular with exertion.  She also reports intermittent headache.  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  She feels short of breath when she is walking doing her job.  Denies cough.  Denies fever.  She reported this to her job today and they had advised her to come in to be evaluated.  There is a concern by her job is [employer] COVID-19.  No known inciting factor.  She does have anxiety but states that she does not feel anxious currently.  No other associated symptoms.  No relieving factors.  No other complaints.  PMH, Surgical Hx, Family Hx, Social History reviewed and updated as below.  Past Medical History:  Diagnosis Date  . Colitis   . Concussion   . Panic attack   . Pneumothorax   . Syncope    Past Surgical History:  Procedure Laterality Date  . ESOPHAGOGASTRODUODENOSCOPY     OB History   No obstetric history on file.    Home Medications    Prior to Admission medications   Not on File    Family History Family History  Adopted: Yes    Social History Social History   Tobacco Use  . Smoking status: Never Smoker  . Smokeless tobacco: Never Used  Substance Use Topics  . Alcohol use: Not Currently  . Drug use: Not Currently    Types: Marijuana    Comment: Last use a month ago     Allergies   Patient has no known allergies.   Review of Systems Review of Systems  Respiratory: Positive for shortness of breath.   Neurological: Positive for headaches.   Physical Exam Triage Vital Signs ED Triage Vitals  Enc Vitals Group     BP 03/05/19 1240 110/78     Pulse Rate 03/05/19 1240 68     Resp 03/05/19 1240 18     Temp 03/05/19  1240 98.1 F (36.7 C)     Temp Source 03/05/19 1240 Oral     SpO2 03/05/19 1240 100 %     Weight 03/05/19 1237 140 lb (63.5 kg)     Height 03/05/19 1237 5\' 4"  (1.626 m)     Head Circumference --      Peak Flow --      Pain Score 03/05/19 1237 0     Pain Loc --      Pain Edu? --      Excl. in GC? --    No data found.  Updated Vital Signs BP 110/78 (BP Location: Right Arm)   Pulse 68   Temp 98.1 F (36.7 C) (Oral)   Resp 18   Ht 5\' 4"  (1.626 m)   Wt 63.5 kg   LMP 02/12/2019   SpO2 100%   BMI 24.03 kg/m   Visual Acuity Right Eye Distance:   Left Eye Distance:   Bilateral Distance:    Right Eye Near:   Left Eye Near:    Bilateral Near:     Physical Exam Vitals signs and nursing note reviewed.  Constitutional:      General: She is not in acute distress.    Appearance: Normal appearance. She is  normal weight. She is not ill-appearing.  HENT:     Head: Normocephalic and atraumatic.     Nose: Nose normal.  Eyes:     General:        Right eye: No discharge.        Left eye: No discharge.     Conjunctiva/sclera: Conjunctivae normal.  Cardiovascular:     Rate and Rhythm: Normal rate and regular rhythm.     Heart sounds: No murmur.  Pulmonary:     Effort: Pulmonary effort is normal. No respiratory distress.     Breath sounds: Normal breath sounds. No wheezing, rhonchi or rales.  Skin:    General: Skin is warm.     Findings: No rash.  Neurological:     Mental Status: She is alert.  Psychiatric:        Mood and Affect: Mood normal.        Behavior: Behavior normal.    UC Treatments / Results  Labs (all labs ordered are listed, but only abnormal results are displayed) Labs Reviewed - No data to display  EKG   Radiology No results found.  Procedures Procedures (including critical care time)  Medications Ordered in UC Medications - No data to display  Initial Impression / Assessment and Plan / UC Course  I have reviewed the triage vital signs and the  nursing notes.  Pertinent labs & imaging results that were available during my care of the patient were reviewed by me and considered in my medical decision making (see chart for details).    18 year old female presents with shortness of breath.  Exam unremarkable and patient is well-appearing.  Work note given.  Supportive care.  Final Clinical Impressions(s) / UC Diagnoses   Final diagnoses:  Shortness of breath     Discharge Instructions     Normal exam and normal vitals.  Rest.  Take care  Dr. Lacinda Axon    ED Prescriptions    None     Controlled Substance Prescriptions [place] Controlled Substance Registry consulted? Not Applicable   Coral Spikes, DO 03/05/19 1406

## 2019-03-05 NOTE — ED Triage Notes (Signed)
Patient stated yesterday she started having SOB and had a headache. She states she is having SOB with exertion. She said walking from the lobby to the exam room she became SOB.

## 2019-03-05 NOTE — Discharge Instructions (Signed)
Normal exam and normal vitals.  Rest.  Take care  Dr. Lacinda Axon

## 2019-04-12 ENCOUNTER — Encounter: Payer: Self-pay | Admitting: Emergency Medicine

## 2019-04-12 ENCOUNTER — Ambulatory Visit
Admission: EM | Admit: 2019-04-12 | Discharge: 2019-04-12 | Disposition: A | Discharge status: Home / Self Care | Payer: No Typology Code available for payment source | Attending: Emergency Medicine | Admitting: Emergency Medicine

## 2019-04-12 ENCOUNTER — Other Ambulatory Visit: Payer: Self-pay

## 2019-04-12 DIAGNOSIS — N3090 Cystitis, unspecified without hematuria: Secondary | ICD-10-CM | POA: Diagnosis not present

## 2019-04-12 DIAGNOSIS — B9689 Other specified bacterial agents as the cause of diseases classified elsewhere: Secondary | ICD-10-CM | POA: Diagnosis not present

## 2019-04-12 LAB — URINALYSIS, COMPLETE (UACMP) WITH MICROSCOPIC
Glucose, UA: NEGATIVE mg/dL
Hgb urine dipstick: NEGATIVE
Ketones, ur: NEGATIVE mg/dL
Nitrite: NEGATIVE
Specific Gravity, Urine: 1.02 (ref 1.005–1.030)
pH: 8.5 — ABNORMAL HIGH (ref 5.0–8.0)

## 2019-04-12 LAB — PREGNANCY, URINE: Preg Test, Ur: NEGATIVE

## 2019-04-12 MED ORDER — NITROFURANTOIN MONOHYD MACRO 100 MG PO CAPS: 100.0000 mg | ORAL_CAPSULE | Freq: Two times a day (BID) | ORAL | 0 refills | Status: DC

## 2019-04-12 MED ORDER — PHENAZOPYRIDINE HCL 200 MG PO TABS: 200.0000 mg | ORAL_TABLET | Freq: Three times a day (TID) | ORAL | 0 refills | Status: DC

## 2019-04-12 NOTE — ED Provider Notes (Signed)
[facility]    CSN: [id number] Arrival date & time: 04/12/19  1009      History   Chief Complaint Chief Complaint  Patient presents with  . Dysuria    HPI Sharon Vaughn [address] Sharon Vaughn is a 18 y.o. female. who presents with onset of dysuria and frequency since this am. Denies abnormal vaginal discharge or fever. Her sexual partner wears condoms and its been a while since he did not wear one when they had sex. Has hx of irregular periods and even with OC's they did not get regular. LMP 1 month ago.     Past Medical History:  Diagnosis Date  . Colitis   . Concussion   . Panic attack   . Pneumothorax   . Syncope     There are no active problems to display for this patient.   Past Surgical History:  Procedure Laterality Date  . ESOPHAGOGASTRODUODENOSCOPY      OB History   No obstetric history on file.      Home Medications    Prior to Admission medications   Not on File    Family History Family History  Adopted: Yes    Social History Social History   Tobacco Use  . Smoking status: Never Smoker  . Smokeless tobacco: Never Used  Substance Use Topics  . Alcohol use: Not Currently  . Drug use: Not Currently    Types: Marijuana    Comment: Last use a month ago     Allergies   Patient has no known allergies.   Review of Systems Review of Systems  Constitutional: Negative for chills and fever.  Genitourinary: Positive for dysuria, frequency and menstrual problem. Negative for difficulty urinating, flank pain and vaginal discharge.  Musculoskeletal: Negative for back pain.  Skin: Negative for rash.     Physical Exam Triage Vital Signs ED Triage Vitals  Enc Vitals Group     BP 04/12/19 1023 111/76     Pulse Rate 04/12/19 1023 61     Resp 04/12/19 1023 14     Temp 04/12/19 1023 98.2 F (36.8 C)     Temp Source 04/12/19 1023 Oral     SpO2 04/12/19 1023 100 %     Weight 04/12/19 1020 140 lb (63.5 kg)     Height 04/12/19 1020 5\' 4"  (1.626 m)      Head Circumference --      Peak Flow --      Pain Score 04/12/19 1020 7     Pain Loc --      Pain Edu? --      Excl. in [place]? --    No data found.  Updated Vital Signs BP 111/76 (BP Location: Left Arm)   Pulse 61   Temp 98.2 F (36.8 C) (Oral)   Resp 14   Ht 5\' 4"  (1.626 m)   Wt 140 lb (63.5 kg)   LMP 03/21/2019 (Approximate)   SpO2 100%   BMI 24.03 kg/m   Visual Acuity Right Eye Distance:   Left Eye Distance:   Bilateral Distance:    Right Eye Near:   Left Eye Near:    Bilateral Near:     Physical Exam Constitutional:      General: She is not in acute distress.    Appearance: Normal appearance.  HENT:     Nose: Nose normal.  Eyes:     Conjunctiva/sclera: Conjunctivae normal.  Skin:    General: Skin is dry.  Neurological:  Mental Status: She is alert.     Gait: Gait normal.  Psychiatric:        Mood and Affect: Mood normal.        Behavior: Behavior normal.        Thought Content: Thought content normal.        Judgment: Judgment normal.   EYES- non icterus NECK -supple ABD- + BS, soft, no masses, tenderness or HSM. Neg CVA tenderness.   UC Treatments / Results  Labs (all labs ordered are listed, but only abnormal results are displayed) Labs Reviewed  URINALYSIS, COMPLETE (UACMP) WITH MICROSCOPIC    EKG   Radiology No results found.  Procedures Procedures (including critical care time)  Medications Ordered in UC Medications - No data to display  Initial Impression / Assessment and Plan / UC Course  I have reviewed the triage vital signs and the nursing notes. Pertinent labs  results that were available during my care of the patient were reviewed by me and considered in my medical decision making (see chart for details). She was placed on Macrobid 10 bid x 5 days. And Pyridium 200 mg tid for 2 days. Urine was sent out for a culture. If symptoms persist, she needs to see PCP for pelvic exam.    Final Clinical Impressions(s) / UC  Diagnoses   Final diagnoses:  None   Discharge Instructions   None    ED Prescriptions    None     PDMP not reviewed this encounter.   Garey Ham, [place] 04/12/19 1219

## 2019-04-12 NOTE — ED Triage Notes (Signed)
Patient c/o burning when urinating and urinary frequency that started early this morning.  Patient denies vaginal discharge.  Patient denies fevers.

## 2019-04-12 NOTE — Discharge Instructions (Addendum)
If your urine culture comes back negative and your symptoms persist you will need to have a pelvic exam done.

## 2019-07-10 ENCOUNTER — Ambulatory Visit: Payer: No Typology Code available for payment source | Attending: Internal Medicine

## 2019-07-10 DIAGNOSIS — Z20822 Contact with and (suspected) exposure to covid-19: Secondary | ICD-10-CM

## 2019-07-16 ENCOUNTER — Ambulatory Visit: Payer: No Typology Code available for payment source | Attending: Internal Medicine

## 2019-07-16 DIAGNOSIS — Z20822 Contact with and (suspected) exposure to covid-19: Secondary | ICD-10-CM

## 2019-07-16 LAB — NOVEL CORONAVIRUS, NAA

## 2019-07-17 LAB — NOVEL CORONAVIRUS, NAA: SARS-CoV-2, NAA: NOT DETECTED

## 2019-11-30 ENCOUNTER — Other Ambulatory Visit: Payer: Self-pay

## 2019-11-30 ENCOUNTER — Ambulatory Visit
Admission: EM | Admit: 2019-11-30 | Discharge: 2019-11-30 | Disposition: A | Discharge status: Home / Self Care | Payer: No Typology Code available for payment source | Attending: Urgent Care | Admitting: Urgent Care

## 2019-11-30 ENCOUNTER — Encounter: Payer: Self-pay | Admitting: Emergency Medicine

## 2019-11-30 DIAGNOSIS — L03115 Cellulitis of right lower limb: Secondary | ICD-10-CM

## 2019-11-30 DIAGNOSIS — S7011XA Contusion of right thigh, initial encounter: Secondary | ICD-10-CM

## 2019-11-30 MED ORDER — MUPIROCIN 2 % EX OINT: TOPICAL_OINTMENT | CUTANEOUS | 0 refills | Status: DC

## 2019-11-30 MED ORDER — CEPHALEXIN 500 MG PO CAPS: 500.0000 mg | ORAL_CAPSULE | Freq: Three times a day (TID) | ORAL | 0 refills | Status: AC

## 2019-11-30 NOTE — ED Triage Notes (Signed)
Patient c/o right knee pain after falling on a dirt track on Wed.  Patient has a large abrasion to her right knee.  Patient c/o increase in redness and tenderness at the site.  Patient denies fevers.

## 2019-11-30 NOTE — Discharge Instructions (Signed)
It was very nice seeing you today in clinic. Thank you for entrusting me with your care.   Keep area clean and dry. Use topical and oral antibiotics as prescribed. Monitor for signs and symptoms of progressing infection, which would include increased redness, swelling, streaking, drainage, pain, and the development of a fever.   Make arrangements to follow up with your regular doctor in 1 week for re-evaluation if not improving. If your symptoms/condition worsens, please seek follow up care either here or in the ER. Please remember, our [facility] providers are "right here with you" when you need [place].   Again, it was my pleasure to take care of you today. Thank you for choosing our clinic. I hope that you start to feel better quickly.   Quentin Mulling, MSN, APRN, FNP-C, CEN Advanced Practice Provider [facility]
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
## 2020-09-30 ENCOUNTER — Encounter: Payer: Self-pay | Admitting: Emergency Medicine

## 2020-09-30 ENCOUNTER — Other Ambulatory Visit: Payer: Self-pay

## 2020-09-30 ENCOUNTER — Ambulatory Visit
Admission: EM | Admit: 2020-09-30 | Discharge: 2020-09-30 | Disposition: A | Discharge status: Home / Self Care | Payer: Managed Care, Other (non HMO) | Attending: Sports Medicine | Admitting: Sports Medicine

## 2020-09-30 DIAGNOSIS — R300 Dysuria: Secondary | ICD-10-CM | POA: Diagnosis not present

## 2020-09-30 DIAGNOSIS — N760 Acute vaginitis: Secondary | ICD-10-CM | POA: Insufficient documentation

## 2020-09-30 DIAGNOSIS — R350 Frequency of micturition: Secondary | ICD-10-CM

## 2020-09-30 DIAGNOSIS — N3000 Acute cystitis without hematuria: Secondary | ICD-10-CM | POA: Diagnosis not present

## 2020-09-30 DIAGNOSIS — R102 Pelvic and perineal pain: Secondary | ICD-10-CM | POA: Diagnosis present

## 2020-09-30 DIAGNOSIS — B9689 Other specified bacterial agents as the cause of diseases classified elsewhere: Secondary | ICD-10-CM | POA: Insufficient documentation

## 2020-09-30 LAB — CHLAMYDIA/NGC RT PCR (ARMC ONLY)
Chlamydia Tr: NOT DETECTED
N gonorrhoeae: NOT DETECTED

## 2020-09-30 LAB — URINALYSIS, COMPLETE (UACMP) WITH MICROSCOPIC
Bilirubin Urine: NEGATIVE
Glucose, UA: NEGATIVE mg/dL
Ketones, ur: NEGATIVE mg/dL
Nitrite: NEGATIVE
Protein, ur: NEGATIVE mg/dL
Specific Gravity, Urine: >1.03 — ABNORMAL HIGH (ref 1.005–1.030)
pH: 6 (ref 5.0–8.0)

## 2020-09-30 LAB — WET PREP, GENITAL
Sperm: NONE SEEN
Trich, Wet Prep: NONE SEEN
WBC, Wet Prep HPF POC: NONE SEEN
Yeast Wet Prep HPF POC: NONE SEEN

## 2020-09-30 MED ORDER — NITROFURANTOIN MONOHYD MACRO 100 MG PO CAPS: 100.0000 mg | ORAL_CAPSULE | Freq: Two times a day (BID) | ORAL | 0 refills | Status: DC

## 2020-09-30 MED ORDER — METRONIDAZOLE 500 MG PO TABS: 500.0000 mg | ORAL_TABLET | Freq: Two times a day (BID) | ORAL | 0 refills | Status: DC

## 2020-09-30 NOTE — Discharge Instructions (Signed)
Your urine shows that you have a potential urinary tract infection.  We need to confirm with a urine culture.  I sent in an antibiotic.  If someone calls you and switches the antibiotic is because the 1 I chose is resistant to the antibiotic.  They may also call you and tell you to stop it if your urine culture does not grow anything. I also called in some Flagyl for your bacterial vaginosis which showed up on your wet prep. I have given you educational handouts on all of this. You can use over-the-counter Azo for any symptoms or having but please flush your system with plenty of water. I gave you a work note just saying you were seen today.  I hope you get feeling better, Dr. Zachery Dauer

## 2020-09-30 NOTE — ED Triage Notes (Signed)
Pt c/o dysuria and vaginal odor. Started this morning. She does not know if the odor is coming from her vagina or her urine. She has a vaginal discharge but it is clear.

## 2020-10-03 LAB — URINE CULTURE: Culture: >=100000 — AB

## 2020-10-05 NOTE — ED Provider Notes (Signed)
[facility]    CSN: [id number] Arrival date & time: 09/30/20  1102      History   Chief Complaint Chief Complaint  Patient presents with  . Dysuria    HPI [address] [place] is a 20 y.o. female.   Patient is a pleasant 20 year old female who presents for evaluation of the above issues.  She reports having pain on urination with a foul odor that began this morning.  She has had some UTIs in the past but nothing recent.  She denies flank pain.  No blood in the urine.  She does have some urgency and increased frequency.  She reports having unprotected sexual intercourse a few days ago.  She denies any significant abdominal pain.  No nausea vomiting or diarrhea.  No fever shakes or chills.  She does not have a primary care provider.  She works from home.  She denies chest pain shortness of breath or any red flag signs or symptoms.     Past Medical History:  Diagnosis Date  . Colitis   . Concussion   . Panic attack   . Pneumothorax   . Syncope     There are no problems to display for this patient.   Past Surgical History:  Procedure Laterality Date  . ESOPHAGOGASTRODUODENOSCOPY      OB History   No obstetric history on file.      Home Medications    Prior to Admission medications   Medication Sig Start Date End Date Taking? Authorizing Provider  ferrous sulfate 325 (65 FE) MG tablet Take 325 mg by mouth daily with breakfast.   Yes [provider]  metroNIDAZOLE (FLAGYL) 500 MG tablet Take 1 tablet (500 mg total) by mouth 2 (two) times daily. 09/30/20  Yes Delton See, MD  Multiple Vitamin (MULTIVITAMIN) tablet Take 1 tablet by mouth daily.   Yes [provider]  nitrofurantoin, macrocrystal-monohydrate, (MACROBID) 100 MG capsule Take 1 capsule (100 mg total) by mouth 2 (two) times daily. 09/30/20  Yes Delton See, MD  mupirocin ointment (BACTROBAN) 2 % AAA BID x 5 days. 11/30/19   Verlee Monte, NP    Family History Family  History  Adopted: Yes    Social History Social History   Tobacco Use  . Smoking status: Never Smoker  . Smokeless tobacco: Never Used  Vaping Use  . Vaping Use: Never used  Substance Use Topics  . Alcohol use: Yes  . Drug use: Not Currently    Types: Marijuana    Comment: Last use a month ago     Allergies   Patient has no known allergies.   Review of Systems Review of Systems  Constitutional: Negative.  Negative for activity change, appetite change, diaphoresis, fatigue and fever.  HENT: Negative.  Negative for congestion.   Eyes: Negative.   Gastrointestinal: Negative.  Negative for abdominal pain, constipation, diarrhea, nausea and vomiting.  Genitourinary: Positive for dysuria. Negative for flank pain, hematuria, pelvic pain, urgency, vaginal bleeding, vaginal discharge and vaginal pain.  Musculoskeletal: Negative.  Negative for back pain and myalgias.  Skin: Negative for color change, rash and wound.  Neurological: Negative.  Negative for dizziness, numbness and headaches.  All other systems reviewed and are negative.    Physical Exam Triage Vital Signs ED Triage Vitals  Enc Vitals Group     BP 09/30/20 1150 133/73     Pulse Rate 09/30/20 1150 70     Resp 09/30/20 1150 18  Temp 09/30/20 1150 98.2 F (36.8 C)     Temp Source 09/30/20 1150 Oral     SpO2 09/30/20 1150 100 %     Weight 09/30/20 1147 179 lb 14.3 oz (81.6 kg)     Height 09/30/20 1147 5\' 4"  (1.626 m)     Head Circumference --      Peak Flow --      Pain Score 09/30/20 1146 3     Pain Loc --      Pain Edu? --      Excl. in GC? --    No data found.  Updated Vital Signs BP 133/73 (BP Location: Left Arm)   Pulse 70   Temp 98.2 F (36.8 C) (Oral)   Resp 18   Ht 5\' 4"  (1.626 m)   Wt 81.6 kg   LMP 09/16/2020 (Approximate)   SpO2 100%   BMI 30.88 kg/m   Visual Acuity Right Eye Distance:   Left Eye Distance:   Bilateral Distance:    Right Eye Near:   Left Eye Near:    Bilateral  Near:     Physical Exam Vitals and nursing note reviewed.  Constitutional:      General: She is not in acute distress.    Appearance: Normal appearance. She is not ill-appearing, toxic-appearing or diaphoretic.  HENT:     Head: Normocephalic and atraumatic.  Cardiovascular:     Rate and Rhythm: Normal rate and regular rhythm.     Pulses: Normal pulses.     Heart sounds: Normal heart sounds. No murmur heard. No friction rub. No gallop.   Pulmonary:     Effort: Pulmonary effort is normal.     Breath sounds: Normal breath sounds. No stridor. No wheezing, rhonchi or rales.  Abdominal:     General: Abdomen is flat.     Tenderness: There is no abdominal tenderness. There is no right CVA tenderness, left CVA tenderness, guarding or rebound.  Musculoskeletal:     Cervical back: Normal range of motion and neck supple.  Skin:    General: Skin is warm and dry.     Capillary Refill: Capillary refill takes less than 2 seconds.  Neurological:     General: No focal deficit present.     Mental Status: She is alert and oriented to person, place, and time.      UC Treatments / Results  Labs (all labs ordered are listed, but only abnormal results are displayed) Labs Reviewed  WET PREP, GENITAL - Abnormal; Notable for the following components:      Result Value   Clue Cells Wet Prep HPF POC PRESENT (*)    All other components within normal limits  URINE CULTURE - Abnormal; Notable for the following components:   Culture >=100,000 COLONIES/mL ESCHERICHIA COLI (*)    Organism ID, Bacteria ESCHERICHIA COLI (*)    All other components within normal limits  URINALYSIS, COMPLETE (UACMP) WITH MICROSCOPIC - Abnormal; Notable for the following components:   APPearance HAZY (*)    Specific Gravity, Urine >1.030 (*)    Hgb urine dipstick TRACE (*)    Leukocytes,Ua TRACE (*)    Bacteria, UA MANY (*)    All other components within normal limits  CHLAMYDIA/NGC RT PCR [facility] ONLY)     EKG   Radiology No results found.  Procedures Procedures (including critical care time)  Medications Ordered in UC Medications - No data to display  Initial Impression / Assessment and Plan / UC Course  I have reviewed the triage vital signs and the nursing notes.  Pertinent labs & imaging results that were available during my care of the patient were reviewed by me and considered in my medical decision making (see chart for details).  Clinical impression: Dysuria and foul-smelling odor since this morning.  Exam and vitals are reassuring.  Treatment plan: 1.  The findings and treatment plan were discussed in detail with the patient.  Patient is in agreement. 2.  We will check a UA.  Results are above.  Does show trace leukocytes and many bacteria.  Will treat for UTI.  We will also send off the culture. 3.  We will check a wet prep.  Does show clue cells present consistent with bacterial vaginosis.  Will treat with Flagyl. 4.  Educational handouts provided. 5.  Gave her a work note. 6.  Want her to flush her system with plenty of fluids.  She can use over-the-counter Azo and/or cranberry juice as well. 7.  Tylenol or Motrin for any discomfort. 8.  If symptoms persist I want her to see her primary care provider.  If they worsen in any way she should go to the emergency room. 9.  Follow-up here as needed.    Final Clinical Impressions(s) / UC Diagnoses   Final diagnoses:  Dysuria  Acute cystitis without hematuria  BV (bacterial vaginosis)  Urinary frequency  Suprapubic abdominal pain     Discharge Instructions     Your urine shows that you have a potential urinary tract infection.  We need to confirm with a urine culture.  I sent in an antibiotic.  If someone calls you and switches the antibiotic is because the 1 I chose is resistant to the antibiotic.  They may also call you and tell you to stop it if your urine culture does not grow anything. I also called in some  Flagyl for your bacterial vaginosis which showed up on your wet prep. I have given you educational handouts on all of this. You can use over-the-counter Azo for any symptoms or having but please flush your system with plenty of water. I gave you a work note just saying you were seen today.  I hope you get feeling better, Dr. Zachery Dauer    ED Prescriptions    Medication Sig Dispense Auth. Provider   nitrofurantoin, macrocrystal-monohydrate, (MACROBID) 100 MG capsule Take 1 capsule (100 mg total) by mouth 2 (two) times daily. 10 capsule Delton See, MD   metroNIDAZOLE (FLAGYL) 500 MG tablet Take 1 tablet (500 mg total) by mouth 2 (two) times daily. 14 tablet Delton See, MD     PDMP not reviewed this encounter.   Delton See, MD 10/05/20 1911

## 2021-05-05 ENCOUNTER — Encounter: Payer: Self-pay | Admitting: Emergency Medicine

## 2021-05-05 ENCOUNTER — Ambulatory Visit
Admission: EM | Admit: 2021-05-05 | Discharge: 2021-05-05 | Disposition: A | Discharge status: Home / Self Care | Payer: Managed Care, Other (non HMO) | Attending: Emergency Medicine | Admitting: Emergency Medicine

## 2021-05-05 ENCOUNTER — Other Ambulatory Visit: Payer: Self-pay

## 2021-05-05 ENCOUNTER — Ambulatory Visit (INDEPENDENT_AMBULATORY_CARE_PROVIDER_SITE_OTHER): Payer: Managed Care, Other (non HMO)

## 2021-05-05 DIAGNOSIS — M79672 Pain in left foot: Secondary | ICD-10-CM | POA: Diagnosis not present

## 2021-05-05 DIAGNOSIS — S9032XA Contusion of left foot, initial encounter: Secondary | ICD-10-CM

## 2021-05-05 NOTE — ED Triage Notes (Signed)
Pt c/o left foot pain. She states she dropped part of a bed on her foot last night. She denies swelling or bruising but states ti hurts to walk on.

## 2021-05-05 NOTE — ED Provider Notes (Signed)
[facility]    CSN: [id number] Arrival date & time: 05/05/21  7262      History   Chief Complaint Chief Complaint  Patient presents with   Foot Pain    left    HPI Sharon Vaughn [address] [place] is a 20 y.o. female.   HPI  20 year old female here for evaluation of left foot pain.  Patient reports that she and her husband were assembling their bed last night, which is supposed to have slats and does not, when the box spring fell through the red frame and landed on the arch of her left foot.  She states that she noticed some bruising, and there is a small abrasion, as well as swelling to the area of impact on her foot.  She denies any numbness or tingling in her toes but states that when she walks she feels pain shooting up to the top of her foot into her calf.  Past Medical History:  Diagnosis Date   Colitis    Concussion    Panic attack    Pneumothorax    Syncope     There are no problems to display for this patient.   Past Surgical History:  Procedure Laterality Date   ESOPHAGOGASTRODUODENOSCOPY      OB History   No obstetric history on file.      Home Medications    Prior to Admission medications   Not on File    Family History Family History  Adopted: Yes    Social History Social History   Tobacco Use   Smoking status: Never   Smokeless tobacco: Never  Vaping Use   Vaping Use: Never used  Substance Use Topics   Alcohol use: Yes   Drug use: Not Currently    Types: Marijuana    Comment: Last use a month ago     Allergies   Patient has no known allergies.   Review of Systems Review of Systems  Constitutional:  Negative for activity change, appetite change and fever.  Musculoskeletal:  Positive for gait problem. Negative for joint swelling.  Skin:  Positive for color change and wound.  Neurological:  Negative for weakness and numbness.  Hematological: Negative.   Psychiatric/Behavioral: Negative.      Physical Exam Triage Vital  Signs ED Triage Vitals  Enc Vitals Group     BP 05/05/21 0945 106/71     Pulse Rate 05/05/21 0945 66     Resp 05/05/21 0945 18     Temp 05/05/21 0945 98.2 F (36.8 C)     Temp Source 05/05/21 0945 Oral     SpO2 05/05/21 0945 100 %     Weight 05/05/21 0942 179 lb 14.3 oz (81.6 kg)     Height 05/05/21 0942 5\' 4"  (1.626 m)     Head Circumference --      Peak Flow --      Pain Score 05/05/21 0941 5     Pain Loc --      Pain Edu? --      Excl. in GC? --    No data found.  Updated Vital Signs BP 106/71 (BP Location: Left Arm)   Pulse 66   Temp 98.2 F (36.8 C) (Oral)   Resp 18   Ht 5\' 4"  (1.626 m)   Wt 179 lb 14.3 oz (81.6 kg)   LMP 04/14/2021 (Approximate)   SpO2 100%   BMI 30.88 kg/m   Visual Acuity Right Eye Distance:   Left Eye Distance:  Bilateral Distance:    Right Eye Near:   Left Eye Near:    Bilateral Near:     Physical Exam Vitals and nursing note reviewed.  Constitutional:      General: She is not in acute distress.    Appearance: Normal appearance. She is not ill-appearing.  Musculoskeletal:        General: Swelling, tenderness and signs of injury present. No deformity. Normal range of motion.  Skin:    General: Skin is warm and dry.     Capillary Refill: Capillary refill takes less than 2 seconds.     Findings: Bruising present. No erythema.  Neurological:     General: No focal deficit present.     Mental Status: She is alert and oriented to person, place, and time.  Psychiatric:        Mood and Affect: Mood normal.        Behavior: Behavior normal.        Thought Content: Thought content normal.        Judgment: Judgment normal.     UC Treatments / Results  Labs (all labs ordered are listed, but only abnormal results are displayed) Labs Reviewed - No data to display  EKG   Radiology DG Foot Complete Left  Result Date: 05/05/2021 CLINICAL DATA:  Left foot pain injury last night EXAM: LEFT FOOT - COMPLETE 3+ VIEW COMPARISON:  None.  FINDINGS: There is no evidence of fracture or dislocation. There is no evidence of arthropathy or other focal bone abnormality. Soft tissue edema about the dorsum of the forefoot. IMPRESSION: No fracture or dislocation of the left foot. Soft tissue edema about the dorsum of the forefoot. Electronically Signed   By: Jearld Lesch M.D.   On: 05/05/2021 10:04    Procedures Procedures (including critical care time)  Medications Ordered in UC Medications - No data to display  Initial Impression / Assessment and Plan / UC Course  I have reviewed the triage vital signs and the nursing notes.  Pertinent labs & imaging results that were available during my care of the patient were reviewed by me and considered in my medical decision making (see chart for details).  Patient is a nontoxic-appearing 20 year old female here for evaluation of left foot pain status post dropping a box spring on it from a bed frame.  This happened last night.  Patient denies any numbness or tingling in her toes and she has full range of motion of her toes, foot, and ankle.  There is swelling to the proximal and medial midfoot with a superficial abrasion and underlying ecchymosis.  The area is tender to touch but there is no crepitus upon palpation.  Patient reports that when she flexes her foot dorsally she feels pain up through her calf.  We will obtain radiograph of left foot.  Radiograph of left foot independently reviewed and evaluated by me.  Impression: No evidence of fracture or dislocation.  Radiology overread is pending. Radiology impression is no fracture or dislocation of the left foot.  Soft tissue edema about the dorsum of the left forefoot.  Patient's exam is consistent with a contusion of the left foot and radiographic imaging supports an absence of bony injury.  We will treat patient conservatively with supportive footwear, elevation, ice, rest, and OTC NSAIDs.   Final Clinical Impressions(s) / UC Diagnoses    Final diagnoses:  Contusion of left foot, initial encounter     Discharge Instructions      Your  x-rays did not reveal the presence of any broken bones or dislocated bones.  Treat your pain with over-the-counter Tylenol and ibuprofen according to the package instructions.  Keep your left foot elevated is much as possible to help decrease swelling and aid in pain relief.  You may apply ice to your foot for 20 minutes at a time 2-3 times a day to decrease swelling and aid in pain relief.  Do not apply ice directly to your skin to make sure that there is a barrier to prevent phase of burning her skin.  If your symptoms do not improve return for reevaluation or follow-up with podiatry.     ED Prescriptions   None    PDMP not reviewed this encounter.   Becky Augusta, NP 05/05/21 1013

## 2021-05-05 NOTE — Discharge Instructions (Addendum)
Your x-rays did not reveal the presence of any broken bones or dislocated bones.  Treat your pain with over-the-counter Tylenol and ibuprofen according to the package instructions.  Keep your left foot elevated is much as possible to help decrease swelling and aid in pain relief.  You may apply ice to your foot for 20 minutes at a time 2-3 times a day to decrease swelling and aid in pain relief.  Do not apply ice directly to your skin to make sure that there is a barrier to prevent phase of burning her skin.  If your symptoms do not improve return for reevaluation or follow-up with podiatry.

## 2023-08-31 IMAGING — CR DG FOOT COMPLETE 3+V*L*
4 series · 4 of 4 positions shown · non-contrast
Comparison: None.

CLINICAL DATA: Left foot pain injury last night

EXAM:
LEFT FOOT - COMPLETE 3+ VIEW

[foot ap]
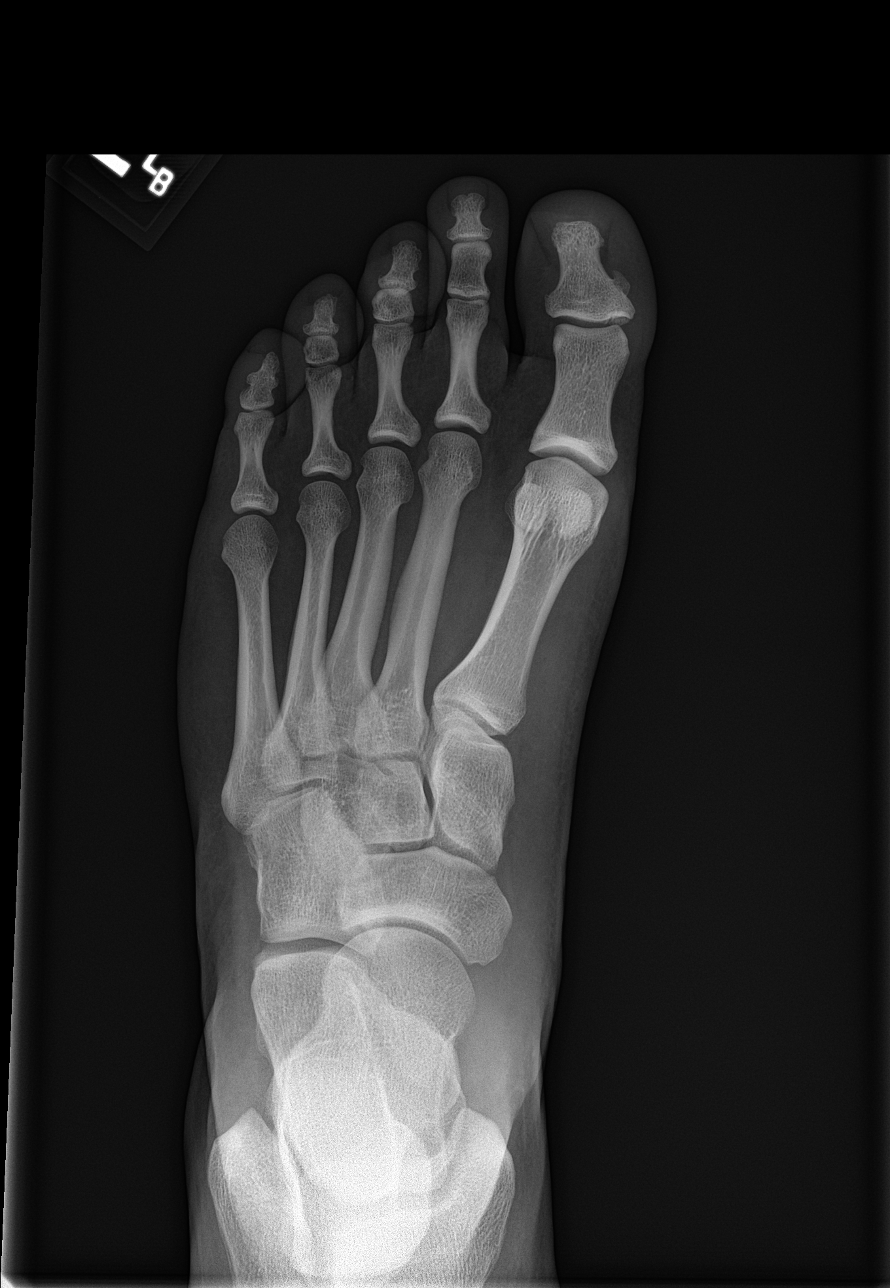

[foot obl]
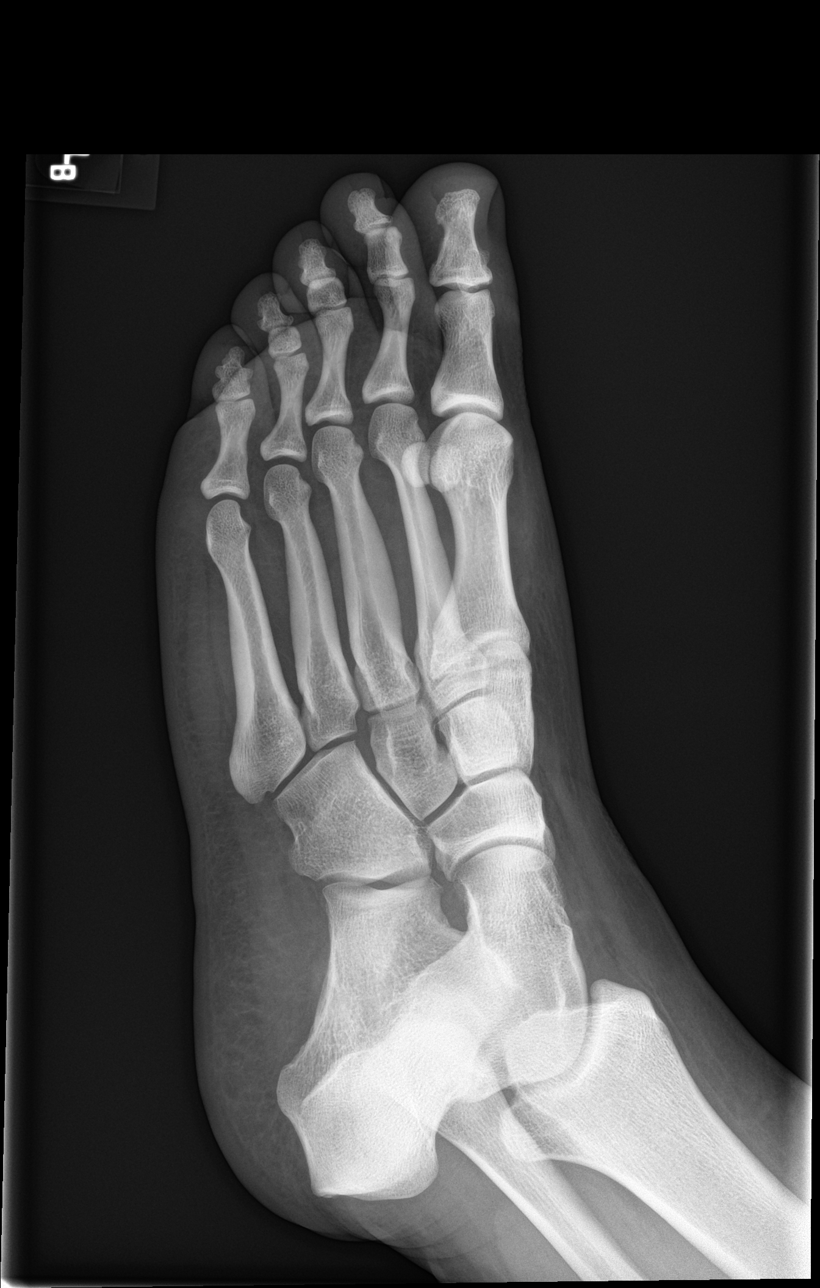

[foot lat (1 of 2)]
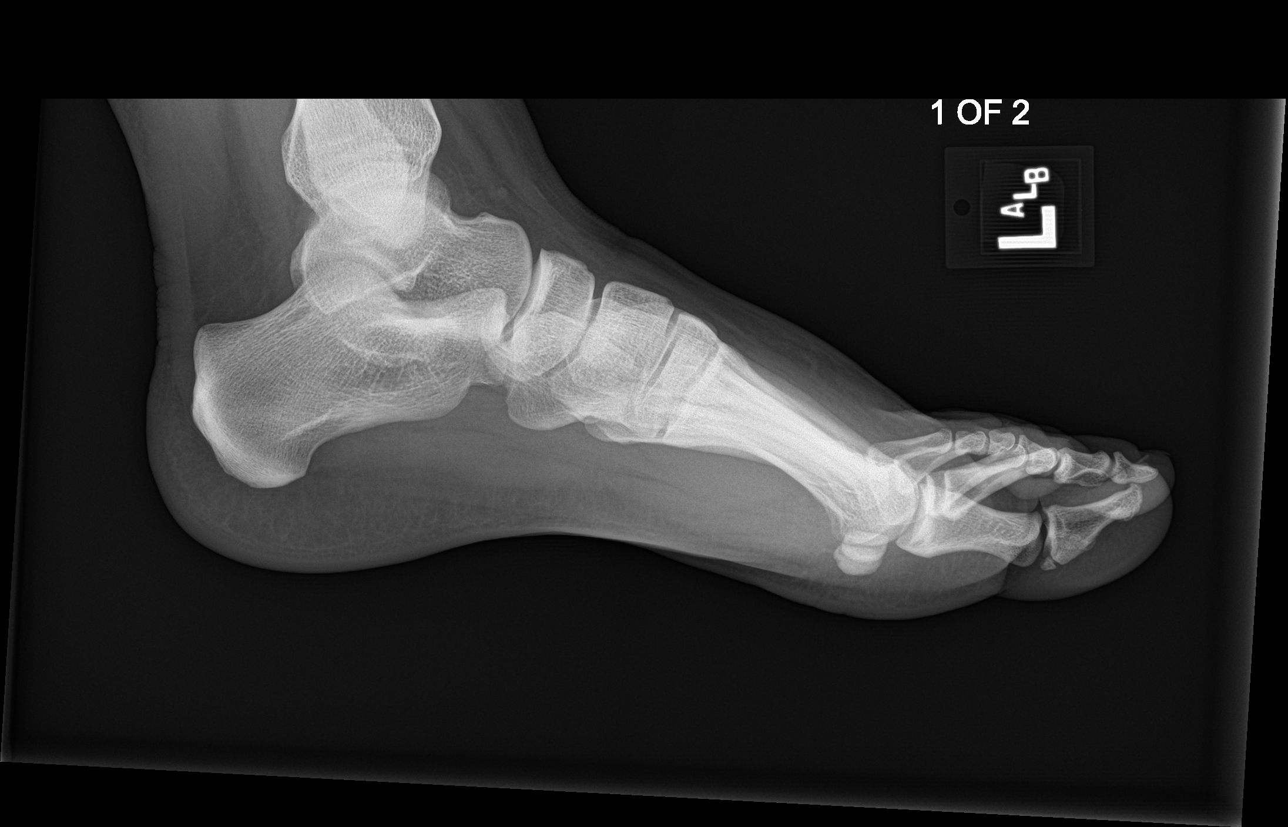

[foot lat (2 of 2)]
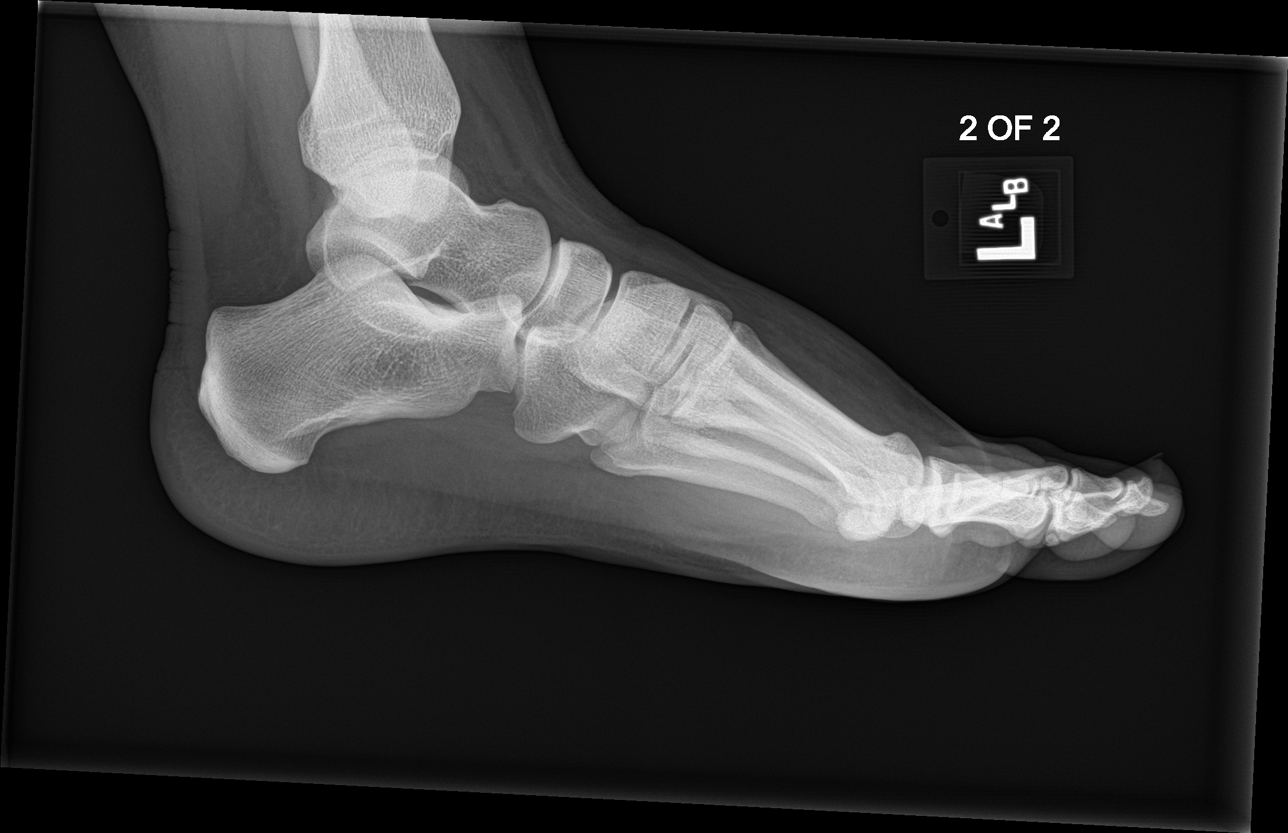

[4 of 4 positions shown; findings below may reference images not displayed]

FINDINGS: There is no evidence of fracture or dislocation. There is no
evidence of arthropathy or other focal bone abnormality. Soft tissue
edema about the dorsum of the forefoot.
IMPRESSION: No fracture or dislocation of the left foot. Soft tissue edema about
the dorsum of the forefoot.

## 2024-07-15 ENCOUNTER — Ambulatory Visit
Admission: EM | Admit: 2024-07-15 | Discharge: 2024-07-15 | Disposition: A | Discharge status: Home / Self Care | Attending: Emergency Medicine | Admitting: Emergency Medicine

## 2024-07-15 DIAGNOSIS — R1013 Epigastric pain: Secondary | ICD-10-CM | POA: Diagnosis not present

## 2024-07-15 DIAGNOSIS — K2990 Gastroduodenitis, unspecified, without bleeding: Secondary | ICD-10-CM | POA: Diagnosis not present

## 2024-07-15 DIAGNOSIS — K2970 Gastritis, unspecified, without bleeding: Secondary | ICD-10-CM | POA: Diagnosis not present

## 2024-07-15 MED ORDER — FAMOTIDINE 20 MG PO TABS: 20.0000 mg | ORAL_TABLET | Freq: Two times a day (BID) | ORAL | 0 refills | Status: AC

## 2024-07-15 MED ORDER — PANTOPRAZOLE SODIUM 40 MG PO TBEC: 40.0000 mg | DELAYED_RELEASE_TABLET | Freq: Every day | ORAL | 0 refills | Status: AC

## 2024-07-15 MED ORDER — ONDANSETRON 8 MG PO TBDP: ORAL_TABLET | ORAL | 0 refills | Status: AC

## 2024-07-15 MED ORDER — SUCRALFATE 1 GM/10ML PO SUSP: 1.0000 g | Freq: Four times a day (QID) | ORAL | 0 refills | Status: AC

## 2024-07-15 NOTE — Discharge Instructions (Addendum)
 I suspect either inflammation of your stomach or a gallbladder problem.  I have put in a referral to gastroenterology for evaluation.  Make sure you keep your primary care appointment on 1/13.  Take the Pepcid , pantoprazole  as directed.  Zofran  as needed for nausea.  Elevate the head of your bed to 30 degrees.  Carafate  will help, but if it is too expensive, you can skip it.  Go to the ER for fevers above 100.4, abdominal distention, worsening pain, and inability to keep anything down despite Zofran , no urine output for 12 hours, or for other concerns.

## 2024-07-15 NOTE — ED Triage Notes (Addendum)
 Sx x 1 yr. Takes omeprazole   Patient states that she's having pain in her diaphragm. States that she vomited about 2 weeks ago for 5 days, hurts to lay down. Got better and started again last night. Patient states that the omeprazole doesn't work. Also taking tums that isn't helping. Patient doesn't have a PCP but is making an appointment as we triage

## 2024-07-15 NOTE — ED Provider Notes (Signed)
 " HPI  SUBJECTIVE:  Sharon Vaughn [address] [place] is a 24 y.o. female who presents with hours long dull, sharp, burning epigastric pain accompanied with 3-4 episodes of nonbilious, nonbloody emesis per day starting several weeks ago.  She got better for a week, but the symptoms restarted yesterday.  She reports intermittent burning chest pain, nausea, sensation of feeling bloated.  No belching, waterbrash.  This pain does not radiate through to her back, migrate or radiate.  No sore throat.  She is tolerating small amounts of fluids.  No palpitations, fevers, coughing, wheezing, shortness of breath, abdominal distention.  She tried heat, and a leftover prescription of omeprazole from a previous urgent care visit for similar symptoms.  Heat and vomiting resolved with the abdominal pain.  Symptoms worse with eating.  Is not associate with exertion.  She has had similar episodes for over the past year, but has never been evaluated by GI for this.  She has a past medical history of GERD, colitis, pneumothorax and syncope, BMI above 30.  No history of H. pylori infection, atrial fibrillation, hypercoagulability, mesenteric ischemia, pancreatitis, gallbladder disease, excess NSAID or alcohol use, peptic ulcer disease, abdominal surgeries, MI, coronary disease, hypercholesterolemia, diabetes, smoking, CVA, PAD/PVD.  Family history negative for MI.  Her sister has gallbladder disease.  LMP: Beginning in December.  Denies the possibility of being pregnant.  PCP: She is scheduled to establish care with [place] [facility] on 1/13  Past Medical History:  Diagnosis Date   Colitis    Concussion    Panic attack    Pneumothorax    Syncope     Past Surgical History:  Procedure Laterality Date   ESOPHAGOGASTRODUODENOSCOPY      Family History  Adopted: Yes    Social History[1]  Current Medications[2]  Allergies[3]   ROS  As noted in HPI.   Physical Exam  BP (!) 137/95 (BP Location: Left Arm)   Pulse 61    Temp 98.5 F (36.9 C) (Oral)   Resp 17   LMP 06/24/2024 (Exact Date)   SpO2 100%   Constitutional: Well developed, well nourished, no acute distress Eyes: PERRL, EOMI, conjunctiva normal bilaterally HENT: Normocephalic, atraumatic,mucus membranes moist Respiratory: Clear to auscultation bilaterally, no rales, no wheezing, no rhonchi Cardiovascular: Normal rate and rhythm, no murmurs, no gallops, no rubs GI: Soft, nondistended, normal bowel sounds, right upper quadrant, epigastric tenderness, no rebound, no guarding.  Negative tap table test.  Negative Murphy. Back: no CVAT skin: No rash, skin intact Musculoskeletal: No edema, no tenderness, no deformities Neurologic: Alert & oriented x 3, CN III-XII grossly intact, no motor deficits, sensation grossly intact Psychiatric: Speech and behavior appropriate   ED Course   Medications - No data to display  Orders Placed This Encounter  Procedures   Ambulatory referral to Gastroenterology    Referral Priority:   Routine    Referral Type:   Consultation    Referral Reason:   Specialty Services Required    Number of Visits Requested:   1   No results found for this or any previous visit (from the past 24 hours). No results found.  ED Clinical Impression  1. Epigastric pain   2. Gastritis and gastroduodenitis      ED Assessment/Plan    Patient presents for epigastric pain and tenderness over the right upper quadrant and epigastric tenderness.  She has had similar symptoms before which were thought to be gastritis.  Low suspicion for mesenteric ischemia, ACS.  Suspect cholelithiasis, gastritis  or peptic ulcer disease.  However, there is no evidence of cholecystitis or other evidence of a surgical abdomen at this time.  I feel that she is stable to be evaluated as an outpatient by her PCP.  Will send home with Zofran , Pepcid , pantoprazole , Carafate .  She is to elevate the head of her bed, push electrolyte containing fluids.  Will  refer to GI.  Discussed MDM, treatment plan, and plan for follow-up with patient Discussed sn/sx that should prompt return to the ED. patient agrees with plan.   Meds ordered this encounter  Medications   pantoprazole  (PROTONIX ) 40 MG tablet    Sig: Take 1 tablet (40 mg total) by mouth daily.    Dispense:  20 tablet    Refill:  0   famotidine  (PEPCID ) 20 MG tablet    Sig: Take 1 tablet (20 mg total) by mouth 2 (two) times daily.    Dispense:  40 tablet    Refill:  0   sucralfate  (CARAFATE ) 1 GM/10ML suspension    Sig: Take 10 mLs (1 g total) by mouth 4 (four) times daily. 10 mL before meals and before bedtime    Dispense:  240 mL    Refill:  0   ondansetron  (ZOFRAN -ODT) 8 MG disintegrating tablet    Sig: 1/2- 1 tablet q 8 hr prn nausea, vomiting    Dispense:  20 tablet    Refill:  0      *This clinic note was created using Scientist, clinical (histocompatibility and immunogenetics). Therefore, there may be occasional mistakes despite careful proofreading. ?      [1]  Social History Tobacco Use   Smoking status: Never   Smokeless tobacco: Never  Vaping Use   Vaping status: Never Used  Substance Use Topics   Alcohol use: Yes   Drug use: Not Currently    Types: Marijuana    Comment: Last use a month ago  [2] No current facility-administered medications for this encounter.  Current Outpatient Medications:    famotidine  (PEPCID ) 20 MG tablet, Take 1 tablet (20 mg total) by mouth 2 (two) times daily., Disp: 40 tablet, Rfl: 0   levothyroxine  (SYNTHROID ) 88 MCG tablet, Take 88 mcg by mouth every morning., Disp: , Rfl:    ondansetron  (ZOFRAN -ODT) 8 MG disintegrating tablet, 1/2- 1 tablet q 8 hr prn nausea, vomiting, Disp: 20 tablet, Rfl: 0   pantoprazole  (PROTONIX ) 40 MG tablet, Take 1 tablet (40 mg total) by mouth daily., Disp: 20 tablet, Rfl: 0   sucralfate  (CARAFATE ) 1 GM/10ML suspension, Take 10 mLs (1 g total) by mouth 4 (four) times daily. 10 mL before meals and before bedtime, Disp: 240 mL, Rfl: 0 [3]  No Known Allergies    Van Knee, MD 07/17/24 1035  "

## 2024-07-17 ENCOUNTER — Other Ambulatory Visit: Payer: Self-pay

## 2024-07-17 ENCOUNTER — Observation Stay
Admission: EM | Admit: 2024-07-17 | Discharge: 2024-07-18 | Disposition: A | Discharge status: Home / Self Care | Attending: Internal Medicine | Admitting: Internal Medicine

## 2024-07-17 ENCOUNTER — Emergency Department

## 2024-07-17 DIAGNOSIS — R1011 Right upper quadrant pain: Secondary | ICD-10-CM | POA: Diagnosis not present

## 2024-07-17 DIAGNOSIS — R109 Unspecified abdominal pain: Secondary | ICD-10-CM | POA: Diagnosis present

## 2024-07-17 DIAGNOSIS — K8050 Calculus of bile duct without cholangitis or cholecystitis without obstruction: Principal | ICD-10-CM | POA: Insufficient documentation

## 2024-07-17 DIAGNOSIS — E876 Hypokalemia: Secondary | ICD-10-CM | POA: Diagnosis not present

## 2024-07-17 DIAGNOSIS — E039 Hypothyroidism, unspecified: Secondary | ICD-10-CM | POA: Diagnosis not present

## 2024-07-17 DIAGNOSIS — K219 Gastro-esophageal reflux disease without esophagitis: Secondary | ICD-10-CM | POA: Insufficient documentation

## 2024-07-17 LAB — COMPREHENSIVE METABOLIC PANEL WITH GFR
ALT: 442 U/L — ABNORMAL HIGH (ref 0–44)
AST: 276 U/L — ABNORMAL HIGH (ref 15–41)
Albumin: 4.6 g/dL (ref 3.5–5.0)
Alkaline Phosphatase: 216 U/L — ABNORMAL HIGH (ref 38–126)
Anion gap: 15 (ref 5–15)
BUN: 11 mg/dL (ref 6–20)
CO2: 25 mmol/L (ref 22–32)
Calcium: 9.5 mg/dL (ref 8.9–10.3)
Chloride: 101 mmol/L (ref 98–111)
Creatinine, Ser: 0.97 mg/dL (ref 0.44–1.00)
GFR, Estimated: >60 mL/min
Glucose, Bld: 107 mg/dL — ABNORMAL HIGH (ref 70–99)
Potassium: 3.4 mmol/L — ABNORMAL LOW (ref 3.5–5.1)
Sodium: 140 mmol/L (ref 135–145)
Total Bilirubin: 2.5 mg/dL — ABNORMAL HIGH (ref 0.0–1.2)
Total Protein: 7.4 g/dL (ref 6.5–8.1)

## 2024-07-17 LAB — CBC
HCT: 44.1 % (ref 36.0–46.0)
Hemoglobin: 14.4 g/dL (ref 12.0–15.0)
MCH: 29.6 pg (ref 26.0–34.0)
MCHC: 32.7 g/dL (ref 30.0–36.0)
MCV: 90.6 fL (ref 80.0–100.0)
Platelets: 277 K/uL (ref 150–400)
RBC: 4.87 MIL/uL (ref 3.87–5.11)
RDW: 12.4 % (ref 11.5–15.5)
WBC: 8.4 K/uL (ref 4.0–10.5)
nRBC: 0 % (ref 0.0–0.2)

## 2024-07-17 LAB — POC URINE PREG, ED: Preg Test, Ur: NEGATIVE

## 2024-07-17 LAB — URINALYSIS, ROUTINE W REFLEX MICROSCOPIC
Bacteria, UA: NONE SEEN
Glucose, UA: NEGATIVE mg/dL
Hgb urine dipstick: NEGATIVE
Ketones, ur: 5 mg/dL — AB
Leukocytes,Ua: NEGATIVE
Nitrite: NEGATIVE
Protein, ur: 30 mg/dL — AB
Specific Gravity, Urine: 1.028 (ref 1.005–1.030)
pH: 5 (ref 5.0–8.0)

## 2024-07-17 LAB — LIPASE, BLOOD: Lipase: 32 U/L (ref 11–51)

## 2024-07-17 MED ORDER — HYDROMORPHONE HCL 1 MG/ML IJ SOLN
0.5000 mg | Freq: Once | INTRAMUSCULAR | Status: AC
  Administered 2024-07-17: 0.5 mg via INTRAVENOUS
  Filled 2024-07-17: qty 0.5

## 2024-07-17 MED ORDER — ONDANSETRON HCL 4 MG/2ML IJ SOLN
4.0000 mg | Freq: Once | INTRAMUSCULAR | Status: AC
  Administered 2024-07-17: 4 mg via INTRAVENOUS
  Filled 2024-07-17: qty 2

## 2024-07-17 MED ORDER — SODIUM CHLORIDE 0.9 % IV BOLUS
1000.0000 mL | Freq: Once | INTRAVENOUS | Status: AC
  Administered 2024-07-17: 1000 mL via INTRAVENOUS

## 2024-07-17 NOTE — ED Triage Notes (Signed)
 Pt reports upper abd pain with n/v for the past few days. Pt was seen at [facility] a few days ago for same and was told she may have gallbladder problems.

## 2024-07-17 NOTE — ED Provider Notes (Signed)
 "  [facility] Provider Note    Event Date/Time   First MD Initiated Contact with Patient 07/17/24 2227     (approximate)  History   Chief Complaint: Abdominal Pain  HPI  Sharon Vaughn [address] [name] is a 24 y.o. female with a past medical history of anxiety, presents to the emergency department for upper abdominal pain.  According to the patient for last few months she has been intermittently experiencing upper abdominal pain especially in the right upper quadrant.  Patient states that it seems to be associated with food sometimes the last hours at times it is lasted several days.  It has been much more persistent over the last 2 to 3 days.  Went to urgent care 2 to 3 days ago and was told it could possibly be her gallbladder and if symptoms worsen she is to come to the emergency department.  Patient states tonight symptoms worsened significantly with upper abdominal pain and nausea.  Physical Exam   Triage Vital Signs: ED Triage Vitals  Encounter Vitals Group     BP 07/17/24 2110 (!) 140/84     Girls Systolic BP Percentile --      Girls Diastolic BP Percentile --      Boys Systolic BP Percentile --      Boys Diastolic BP Percentile --      Pulse Rate 07/17/24 2110 85     Resp 07/17/24 2110 18     Temp 07/17/24 2112 98.6 F (37 C)     Temp src --      SpO2 07/17/24 2110 100 %     Weight 07/17/24 2109 204 lb (92.5 kg)     Height 07/17/24 2109 5' 6 (1.676 m)     Head Circumference --      Peak Flow --      Pain Score 07/17/24 2109 8     Pain Loc --      Pain Education --      Exclude from Growth Chart --     Most recent vital signs: Vitals:   07/17/24 2110 07/17/24 2112  BP: (!) 140/84   Pulse: 85   Resp: 18   Temp:  98.6 F (37 C)  SpO2: 100%     General: Awake, no distress.  CV:  Good peripheral perfusion.  Regular rate and rhythm  Resp:  Normal effort.  Equal breath sounds bilaterally.  Abd:  No distention.  Soft, moderate right upper quadrant  tenderness.  No rebound or guarding.  ED Results / Procedures / Treatments   RADIOLOGY  Ultrasound pending   MEDICATIONS ORDERED IN ED: Medications  sodium chloride  0.9 % bolus 1,000 mL (has no administration in time range)  HYDROmorphone  (DILAUDID ) injection 0.5 mg (has no administration in time range)  ondansetron  (ZOFRAN ) injection 4 mg (has no administration in time range)     IMPRESSION / MDM / ASSESSMENT AND PLAN / ED COURSE  I reviewed the triage vital signs and the nursing notes.  Patient's presentation is most consistent with acute presentation with potential threat to life or bodily function.  Patient presents to the emergency department for upper abdominal pain intermittent over the last few months but more consistent over the last several days.  Patient has significant right upper quadrant tenderness but otherwise benign abdomen.  Patient's lab work today reassuringly shows a normal CBC with a normal white blood cell count.  Chemistry shows LFT elevation including total bili elevation.  Will obtain a right  upper quadrant ultrasound to further evaluate.  Reassuringly lipase is normal.  Urinalysis is normal pregnancy test negative.  Will treat pain nausea IV hydrate while awaiting right upper quadrant results.  Patient may require MRCP.    Ultrasound pending patient care signed out to oncoming provider.  FINAL CLINICAL IMPRESSION(S) / ED DIAGNOSES   Right upper quadrant abdominal pain Elevated liver function test  Note:  This document was prepared using Dragon voice recognition software and may include unintentional dictation errors.   Dorothyann Drivers, MD 07/17/24 2252  "

## 2024-07-18 ENCOUNTER — Emergency Department

## 2024-07-18 DIAGNOSIS — R1011 Right upper quadrant pain: Principal | ICD-10-CM

## 2024-07-18 DIAGNOSIS — E039 Hypothyroidism, unspecified: Secondary | ICD-10-CM

## 2024-07-18 DIAGNOSIS — K219 Gastro-esophageal reflux disease without esophagitis: Secondary | ICD-10-CM | POA: Insufficient documentation

## 2024-07-18 DIAGNOSIS — R7989 Other specified abnormal findings of blood chemistry: Secondary | ICD-10-CM | POA: Diagnosis not present

## 2024-07-18 DIAGNOSIS — E876 Hypokalemia: Secondary | ICD-10-CM | POA: Diagnosis not present

## 2024-07-18 DIAGNOSIS — K8050 Calculus of bile duct without cholangitis or cholecystitis without obstruction: Secondary | ICD-10-CM | POA: Diagnosis present

## 2024-07-18 MED ORDER — LEVOTHYROXINE SODIUM 88 MCG PO TABS
88.0000 ug | ORAL_TABLET | Freq: Every day | ORAL | Status: DC
  Administered 2024-07-18: 88 ug via ORAL
  Filled 2024-07-18: qty 1

## 2024-07-18 MED ORDER — ONDANSETRON HCL 4 MG PO TABS: 4.0000 mg | ORAL_TABLET | Freq: Four times a day (QID) | ORAL | Status: DC | PRN

## 2024-07-18 MED ORDER — PIPERACILLIN-TAZOBACTAM 3.375 G IVPB
3.3750 g | Freq: Three times a day (TID) | INTRAVENOUS | Status: DC
  Administered 2024-07-18: 3.375 g via INTRAVENOUS
  Filled 2024-07-18: qty 50

## 2024-07-18 MED ORDER — SODIUM CHLORIDE 0.9 % IV SOLN: INTRAVENOUS | Status: DC

## 2024-07-18 MED ORDER — ONDANSETRON HCL 4 MG/2ML IJ SOLN: 4.0000 mg | Freq: Four times a day (QID) | INTRAMUSCULAR | Status: DC | PRN

## 2024-07-18 MED ORDER — TRAZODONE HCL 50 MG PO TABS: 25.0000 mg | ORAL_TABLET | Freq: Every evening | ORAL | Status: DC | PRN

## 2024-07-18 MED ORDER — PIPERACILLIN-TAZOBACTAM 3.375 G IVPB 30 MIN: 3.3750 g | Freq: Three times a day (TID) | INTRAVENOUS | Status: DC

## 2024-07-18 MED ORDER — ACETAMINOPHEN 325 MG RE SUPP: 650.0000 mg | Freq: Four times a day (QID) | RECTAL | Status: DC | PRN

## 2024-07-18 MED ORDER — ACETAMINOPHEN 325 MG PO TABS: 650.0000 mg | ORAL_TABLET | Freq: Four times a day (QID) | ORAL | Status: DC | PRN

## 2024-07-18 MED ORDER — MAGNESIUM HYDROXIDE 400 MG/5ML PO SUSP: 30.0000 mL | Freq: Every day | ORAL | Status: DC | PRN

## 2024-07-18 MED ORDER — HYDROMORPHONE HCL 1 MG/ML IJ SOLN: 0.5000 mg | INTRAMUSCULAR | Status: DC | PRN

## 2024-07-18 MED ORDER — SUCRALFATE 1 GM/10ML PO SUSP
1.0000 g | Freq: Three times a day (TID) | ORAL | Status: DC
  Filled 2024-07-18 (×3): qty 10

## 2024-07-18 MED ORDER — GADOBUTROL 1 MMOL/ML IV SOLN
9.0000 mL | Freq: Once | INTRAVENOUS | Status: AC | PRN
  Administered 2024-07-18: 9 mL via INTRAVENOUS

## 2024-07-18 NOTE — ED Provider Notes (Signed)
 MRCP shows choledocholithiasis.  No obvious infection.  Consulted with GI Dr. Onita who recommends Zosyn .  GI also notes that the schedule appears to have ERCP capabilities in the morning here at [facility] but is awaiting callback from ERCP Dr. For confirmation.  It is currently 2 AM.  Will admit to hospitalist service and anticipate ERCP in the morning by GI services here.   Cyrena Mylar, MD 07/18/24 [phone]

## 2024-07-18 NOTE — Consult Note (Addendum)
 Brief GI Note I spoke with EM physician last night Patient with positive MRI MRCP for choledocholithiasis without cholangitis.  Unfortunately, ERCP not available at this facility today or this weekend.  Patient unable to be transferred to [facility] due to lack of space and availability.  Patient decided to leave this hospital to go to a facility with ERCP availability prior to being seen by me.  Sharon EMERSON Jungling, DO [facility] Gastroenterology

## 2024-07-18 NOTE — Discharge Summary (Signed)
 " Physician Discharge Summary   Patient: Sharon Vaughn MRN: [id number] DOB: Mar 12, 2001  Admit date:     07/17/2024  Discharge date: 07/18/2024  Discharge Physician: Amaryllis Dare   PCP: Pcp, No   Recommendations at discharge:  Please obtain CBC and CMP on follow-up Patient need ERCP and would like to go to a tertiary care center by herself.  Discharge Diagnoses: Principal Problem:   Choledocholithiasis Active Problems:   Hypothyroidism   Hypokalemia   GERD without esophagitis   Right upper quadrant abdominal pain   Hospital Course: Partly taken from prior notes.  Sharon Vaughn is a 24 y.o. female with medical history significant for hypothyroidism, who presented to the emergency room with complaint of right upper quadrant abdominal pain since Monday with associated radiation to her epigastric area as well as recurrent nausea and vomiting.   On presentation borderline soft blood pressure, otherwise stable vitals.  Labs with potassium of 3.4, alkaline phosphatase 216, AST 276, ALT 442, T. bili 2.5.  UA was unremarkable, CBC normal. RUQ ultrasound revealed the followings 1. Cholelithiasis, without associated inflammatory changes to suggest acute cholecystitis. 2. Dilated common bile duct, measuring 10 mm, with associated mild central intrahepatic bile duct dilatation. Consider ERCP or MRCP to assess for choledocholithiasis.  GI and general surgery was consulted.  1/9: MRCP confirmed cholelithiasis without evidence of acute cholecystitis, choledocholithiasis with a 5 mm distal common bile duct stone.  ERCP was suggested.  Unfortunately ERCP was not available at [facility].  We discussed with Dr. Abran at [facility] who was the only provider available to do ERCP, unfortunately he was completely booked and unable to take any further patients and acceptance was declined.  Patient currently hemodynamically stable, mild right upper quadrant discomfort.  Patient do have transaminitis which is  consistent with obstruction.  Case was discussed with patient and her husband.  Patient will get benefit from going to a tertiary care center.  Tertiary care center currently not accepting any inpatient transfers due to being on capacity.  Patient and husband requested discharge so they can seek medical attention at a tertiary care center of their choice by themselves.  Patient is being discharged at her request and she will seek medical attention of her choice of tertiary care center.   Consultants: Gastroenterology Procedures performed: None Disposition: Home Diet recommendation:  Regular diet DISCHARGE MEDICATION: Allergies as of 07/18/2024   No Known Allergies      Medication List     TAKE these medications    famotidine  20 MG tablet Commonly known as: PEPCID  Take 1 tablet (20 mg total) by mouth 2 (two) times daily.   levothyroxine  88 MCG tablet Commonly known as: SYNTHROID  Take 88 mcg by mouth every morning.   ondansetron  8 MG disintegrating tablet Commonly known as: ZOFRAN -ODT 1/2- 1 tablet q 8 hr prn nausea, vomiting   pantoprazole  40 MG tablet Commonly known as: PROTONIX  Take 1 tablet (40 mg total) by mouth daily.   sucralfate  1 GM/10ML suspension Commonly known as: Carafate  Take 10 mLs (1 g total) by mouth 4 (four) times daily. 10 mL before meals and before bedtime        Discharge Exam: Filed Weights   07/17/24 2109  Weight: 92.5 kg   General.  Overweight young lady, in no acute distress. Pulmonary.  Lungs clear bilaterally, normal respiratory effort. CV.  Regular rate and rhythm, no JVD, rub or murmur. Abdomen.  Soft, nontender, nondistended, BS positive. CNS.  Alert and oriented .  No focal neurologic deficit. Extremities.  No edema, no cyanosis, pulses intact and symmetrical. Psychiatry.  Judgment and insight appears normal.   Condition at discharge: stable  The results of significant diagnostics from this hospitalization (including imaging,  microbiology, ancillary and laboratory) are listed below for reference.   Imaging Studies: MR ABDOMEN MRCP W WO CONTRAST Result Date: 07/18/2024 EXAM: MRCP WITH AND WITHOUT IV CONTRAST 07/18/2024 01:31:32 AM TECHNIQUE: Multisequence, multiplanar magnetic resonance images of the abdomen with and without intravenous contrast. MRCP sequences were performed. 9 mL (gadobutrol  (GADAVIST ) 1 MMOL/ML injection 9 mL GADOBUTROL  1 MMOL/ML IV SOLN) was administered. COMPARISON: None available. CLINICAL HISTORY: Cholelithiasis; RUQ abdominal pain, biliary disease suspected, US  nondiagnostic. FINDINGS: LIVER: 7 mm probable hemangioma in the posterior right hepatic lobe (series 9/image 14), benign. Mild central intrahepatic ductal prominence. GALLBLADDER AND BILIARY SYSTEM: Layering gallstones, without associated inflammatory changes to suggest acute cholecystitis. Mild central intrahepatic ductal prominence. Dilated common duct measuring 12 mm proximally, with associated 5 mm distal common bile duct stone (series 3/image 18). SPLEEN: Unremarkable. PANCREAS/PANCREATIC DUCT: Pancreas is unremarkable. No pancreatic ductal dilatation. ADRENAL GLANDS: Unremarkable. KIDNEYS: Unremarkable. LYMPH NODES: No lymphadenopathy. VASCULATURE: Unremarkable. PERITONEUM: No ascites. ABDOMINAL WALL: No hernia. No mass. BOWEL: Grossly unremarkable. No bowel obstruction. BONES: No abnormality. SOFT TISSUES: Unremarkable. MISCELLANEOUS: Unremarkable. IMPRESSION: 1. Cholelithiasis, without evidence of acute cholecystitis. 2. Choledocholithiasis with a 5 mm distal common bile duct stone. Common duct measures 12 mm. ERCP is suggested. Electronically signed by: Pinkie Pebbles MD MD 07/18/2024 01:40 AM EST RP Workstation: [id number]   US  ABDOMEN LIMITED RUQ (LIVER/GB) Result Date: 07/17/2024 EXAM: Right Upper Quadrant Abdominal Ultrasound 07/17/2024 11:09:45 PM TECHNIQUE: Real-time ultrasonography of the right upper quadrant of the abdomen was  performed. COMPARISON: None available. CLINICAL HISTORY: RUQ pain x 2 weeks; elevated LFTs. FINDINGS: LIVER: Mild hyperechoic hepatic parenchyma, raising the possibility of hepatic steatosis. Hepatopetal flow in the portal vein. No evidence of mass. BILIARY SYSTEM: Multiple gallstones, measuring up to 10 mm. Negative sonographic Murphy's sign. The common bile duct measures 10 mm, dilated. Associated mild central intrahepatic bile duct dilatation. No pericholecystic fluid or wall thickening. OTHER: No right upper quadrant ascites. IMPRESSION: 1. Cholelithiasis, without associated inflammatory changes to suggest acute cholecystitis. 2. Dilated common bile duct, measuring 10 mm, with associated mild central intrahepatic bile duct dilatation. Consider ERCP or MRCP to assess for choledocholithiasis. Electronically signed by: Pinkie Pebbles MD MD 07/17/2024 11:16 PM EST RP Workstation: [id number]    Microbiology: Results for orders placed or performed during the hospital encounter of 09/30/20  Wet prep, genital     Status: Abnormal   Collection Time: 09/30/20 11:51 AM   Specimen: Vaginal  Result Value Ref Range Status   Yeast Wet Prep HPF POC NONE SEEN NONE SEEN Final    Comment: Swab received with less than 0.5 mL of saline, saline added to specimen, interpret results with caution.   Trich, Wet Prep NONE SEEN NONE SEEN Final   Clue Cells Wet Prep HPF POC PRESENT (A) NONE SEEN Final   WBC, Wet Prep HPF POC NONE SEEN NONE SEEN Final   Sperm NONE SEEN  Final    Comment: Performed at [facility], [address] [place], [place] [address]  Chlamydia/NGC rt PCR [facility] only)     Status: None   Collection Time: 09/30/20 11:51 AM   Specimen: Vaginal  Result Value Ref Range Status   Specimen source GC/Chlam ENDOCERVICAL  Final    Comment: Performed at [facility] Urgent  [facility], [address], [place], [place] [address]   Chlamydia Tr NOT DETECTED NOT DETECTED Final   N gonorrhoeae NOT  DETECTED NOT DETECTED Final    Comment: (NOTE) This CT/NG assay has not been evaluated in patients with a history of  hysterectomy. Performed at [facility], [address] [place], [place] [address]   Urine culture     Status: Abnormal   Collection Time: 09/30/20 11:51 AM   Specimen: Urine, Clean Catch  Result Value Ref Range Status   Specimen Description   Final    URINE, CLEAN CATCH Performed at [facility], [address], [place], [place] [address]    Special Requests   Final    NONE Performed at [facility], [address], [place], [place] [address]    Culture >=100,000 COLONIES/mL ESCHERICHIA COLI (A)  Final   Report Status 10/03/2020 FINAL  Final   Organism ID, Bacteria ESCHERICHIA COLI (A)  Final      Susceptibility   Escherichia coli - MIC*    AMPICILLIN >=32 RESISTANT Resistant     CEFAZOLIN <=4 SENSITIVE Sensitive     CEFEPIME <=0.12 SENSITIVE Sensitive     CEFTRIAXONE <=0.25 SENSITIVE Sensitive     CIPROFLOXACIN <=0.25 SENSITIVE Sensitive     GENTAMICIN <=1 SENSITIVE Sensitive     IMIPENEM <=0.25 SENSITIVE Sensitive     NITROFURANTOIN  <=16 SENSITIVE Sensitive     TRIMETH/SULFA <=20 SENSITIVE Sensitive     AMPICILLIN/SULBACTAM 16 INTERMEDIATE Intermediate     PIP/TAZO <=4 SENSITIVE Sensitive     * >=100,000 COLONIES/mL ESCHERICHIA COLI    Labs: CBC: Recent Labs  Lab 07/17/24 2112  WBC 8.4  HGB 14.4  HCT 44.1  MCV 90.6  PLT 277   Basic Metabolic Panel: Recent Labs  Lab 07/17/24 2112  NA 140  K 3.4*  CL 101  CO2 25  GLUCOSE 107*  BUN 11  CREATININE 0.97  CALCIUM 9.5   Liver Function Tests: Recent Labs  Lab 07/17/24 2112  AST 276*  ALT 442*  ALKPHOS 216*  BILITOT 2.5*  PROT 7.4  ALBUMIN 4.6   CBG: No results for input(s): GLUCAP in the last 168 hours.  Discharge time spent: greater than 30 minutes.  This record has been created using Conservation officer, historic buildings. Errors have been  sought and corrected,but may not always be located. Such creation errors do not reflect on the standard of care.   Signed: Amaryllis Dare, MD [facility] 07/18/2024 "

## 2024-07-18 NOTE — Assessment & Plan Note (Addendum)
-   The patient will be admitted to a medical telemetry bed. - This is associated with elevated LFTs - Pain management will be provided. - She will be kept NPO. - GI consult to be obtained. - Dr. Onita was notified about the patient and is aware.  He will be communicating with Dr. Jinny. - She is expected to have ERCP.

## 2024-07-18 NOTE — Assessment & Plan Note (Signed)
-   Will replace potassium and check magnesium

## 2024-07-18 NOTE — Assessment & Plan Note (Signed)
-   Will continue PPI therapy with IV Protonix  and H2 blocker therapy can be resumed with Pepcid .  She can resume Carafate  as well.

## 2024-07-18 NOTE — ED Notes (Signed)
 Pt given DC instructions. Pt verbalized understanding of follow up care. Pt ambulatory from ED without difficulty.

## 2024-07-18 NOTE — H&P (Addendum)
 "     [place]   PATIENT NAME: Sharon Vaughn    MR#:  [id number]  DATE OF BIRTH:  2000-08-02  DATE OF ADMISSION:  07/17/2024  PRIMARY CARE PHYSICIAN: Pcp, No   Patient is coming from: Home  REQUESTING/REFERRING PHYSICIAN: Cyrena Mylar, MD  CHIEF COMPLAINT:   Chief Complaint  Patient presents with   Abdominal Pain    HISTORY OF PRESENT ILLNESS:  Sharon Vaughn [address] [place] is a 24 y.o. female with medical history significant for hypothyroidism, who presented to the emergency room with a [place] of right upper quadrant abdominal pain since Monday with associated radiation to her epigastric area as well as recurrent nausea and vomiting.  She denies any bilious vomitus or hematemesis.  No fever or chills.  She denies any dysuria, oliguria or hematuria or flank pain.  She admitted to heartburn without chest pain.  No dyspnea or cough or wheezing.  No bleeding diathesis.  ED Course: When the patient came to the ER, BP was 102/62 and later 92/54 with otherwise normal vital signs.  Labs revealed potassium of 3.4 and alk phos 216 with AST of 276, ALT 442 and total bili 2.5.  CBC was normal.  Urine pregnancy test was negative.  UA was unremarkable. EKG as reviewed by me : None. Imaging: Right upper quadrant ultrasound revealed the following: 1. Cholelithiasis, without associated inflammatory changes to suggest acute cholecystitis. 2. Dilated common bile duct, measuring 10 mm, with associated mild central intrahepatic bile duct dilatation. Consider ERCP or MRCP to assess for choledocholithiasis.  MRCP was ordered and reports currently pending.  The patient was given IV Zosyn , 0.5 mg IV Dilaudid , 4 mg of IV Zofran  and 1 L bolus of IV normal saline.  Dr. Onita was notified about the patient and is aware.  The patient will be admitted to a telemetry bed for further evaluation and management. PAST MEDICAL HISTORY:   Past Medical History:  Diagnosis Date   Colitis    Concussion    Panic attack     Pneumothorax    Syncope   -Hypothyroidism  PAST SURGICAL HISTORY:   Past Surgical History:  Procedure Laterality Date   ESOPHAGOGASTRODUODENOSCOPY      SOCIAL HISTORY:   Social History   Tobacco Use   Smoking status: Never   Smokeless tobacco: Never  Substance Use Topics   Alcohol use: Yes    FAMILY HISTORY:   Family History  Adopted: Yes    DRUG ALLERGIES:  Allergies[1]  REVIEW OF SYSTEMS:   ROS As per history of present illness. All pertinent systems were reviewed above. Constitutional, HEENT, cardiovascular, respiratory, GI, GU, musculoskeletal, neuro, psychiatric, endocrine, integumentary and hematologic systems were reviewed and are otherwise negative/unremarkable except for positive findings mentioned above in the HPI.   MEDICATIONS AT HOME:   Prior to Admission medications  Medication Sig Start Date End Date Taking? Authorizing Provider  levothyroxine  (SYNTHROID ) 88 MCG tablet Take 88 mcg by mouth every morning. 04/16/24  Yes [provider]  ondansetron  (ZOFRAN -ODT) 8 MG disintegrating tablet 1/2- 1 tablet q 8 hr prn nausea, vomiting 07/15/24  Yes Van Knee, MD  sucralfate  (CARAFATE ) 1 GM/10ML suspension Take 10 mLs (1 g total) by mouth 4 (four) times daily. 10 mL before meals and before bedtime 07/15/24  Yes Van Knee, MD  famotidine  (PEPCID ) 20 MG tablet Take 1 tablet (20 mg total) by mouth 2 (two) times daily. Patient not taking: Reported on 07/18/2024 07/15/24   Van Knee, MD  pantoprazole  (  PROTONIX ) 40 MG tablet Take 1 tablet (40 mg total) by mouth daily. Patient not taking: Reported on 07/18/2024 07/15/24   Van Knee, MD      VITAL SIGNS:  Blood pressure (!) 103/59, pulse (!) 49, temperature 97.9 F (36.6 C), temperature source Oral, resp. rate 16, height 5' 6 (1.676 m), weight 92.5 kg, last menstrual period 06/24/2024, SpO2 99%.  PHYSICAL EXAMINATION:  Physical Exam  GENERAL:  24 y.o.-year-old patient lying in the  bed with no acute distress.  EYES: Pupils equal, round, reactive to light and accommodation. No scleral icterus. Extraocular muscles intact.  HEENT: Head atraumatic, normocephalic. Oropharynx and nasopharynx clear.  NECK:  Supple, no jugular venous distention. No thyroid enlargement, no tenderness.  LUNGS: Normal breath sounds bilaterally, no wheezing, rales,rhonchi or crepitation. No use of accessory muscles of respiration.  CARDIOVASCULAR: Regular rate and rhythm, S1, S2 normal. No murmurs, rubs, or gallops.  ABDOMEN: Soft, nondistended, with epigastric and right upper quadrant tenderness without Mutaz guarding or rigidity.  Negative Murphy sign.  Bowel sounds present. No organomegaly or mass.  EXTREMITIES: No pedal edema, cyanosis, or clubbing.  NEUROLOGIC: Cranial nerves II through XII are intact. Muscle strength 5/5 in all extremities. Sensation intact. Gait not checked.  PSYCHIATRIC: The patient is alert and oriented x 3.  Normal affect and good eye contact. SKIN: No obvious rash, lesion, or ulcer.   LABORATORY PANEL:   CBC Recent Labs  Lab 07/17/24 2112  WBC 8.4  HGB 14.4  HCT 44.1  PLT 277   ------------------------------------------------------------------------------------------------------------------  Chemistries  Recent Labs  Lab 07/17/24 2112  NA 140  K 3.4*  CL 101  CO2 25  GLUCOSE 107*  BUN 11  CREATININE 0.97  CALCIUM 9.5  AST 276*  ALT 442*  ALKPHOS 216*  BILITOT 2.5*   ------------------------------------------------------------------------------------------------------------------  Cardiac Enzymes No results for input(s): TROPONINI in the last 168 hours. ------------------------------------------------------------------------------------------------------------------  RADIOLOGY:  MR ABDOMEN MRCP W WO CONTRAST Result Date: 07/18/2024 EXAM: MRCP WITH AND WITHOUT IV CONTRAST 07/18/2024 01:31:32 AM TECHNIQUE: Multisequence, multiplanar magnetic  resonance images of the abdomen with and without intravenous contrast. MRCP sequences were performed. 9 mL (gadobutrol  (GADAVIST ) 1 MMOL/ML injection 9 mL GADOBUTROL  1 MMOL/ML IV SOLN) was administered. COMPARISON: None available. CLINICAL HISTORY: Cholelithiasis; RUQ abdominal pain, biliary disease suspected, US  nondiagnostic. FINDINGS: LIVER: 7 mm probable hemangioma in the posterior right hepatic lobe (series 9/image 14), benign. Mild central intrahepatic ductal prominence. GALLBLADDER AND BILIARY SYSTEM: Layering gallstones, without associated inflammatory changes to suggest acute cholecystitis. Mild central intrahepatic ductal prominence. Dilated common duct measuring 12 mm proximally, with associated 5 mm distal common bile duct stone (series 3/image 18). SPLEEN: Unremarkable. PANCREAS/PANCREATIC DUCT: Pancreas is unremarkable. No pancreatic ductal dilatation. ADRENAL GLANDS: Unremarkable. KIDNEYS: Unremarkable. LYMPH NODES: No lymphadenopathy. VASCULATURE: Unremarkable. PERITONEUM: No ascites. ABDOMINAL WALL: No hernia. No mass. BOWEL: Grossly unremarkable. No bowel obstruction. BONES: No abnormality. SOFT TISSUES: Unremarkable. MISCELLANEOUS: Unremarkable. IMPRESSION: 1. Cholelithiasis, without evidence of acute cholecystitis. 2. Choledocholithiasis with a 5 mm distal common bile duct stone. Common duct measures 12 mm. ERCP is suggested. Electronically signed by: Pinkie Pebbles MD MD 07/18/2024 01:40 AM EST RP Workstation: [id number]   US  ABDOMEN LIMITED RUQ (LIVER/GB) Result Date: 07/17/2024 EXAM: Right Upper Quadrant Abdominal Ultrasound 07/17/2024 11:09:45 PM TECHNIQUE: Real-time ultrasonography of the right upper quadrant of the abdomen was performed. COMPARISON: None available. CLINICAL HISTORY: RUQ pain x 2 weeks; elevated LFTs. FINDINGS: LIVER: Mild hyperechoic hepatic parenchyma, raising the possibility of hepatic steatosis. Hepatopetal flow  in the portal vein. No evidence of mass. BILIARY  SYSTEM: Multiple gallstones, measuring up to 10 mm. Negative sonographic Murphy's sign. The common bile duct measures 10 mm, dilated. Associated mild central intrahepatic bile duct dilatation. No pericholecystic fluid or wall thickening. OTHER: No right upper quadrant ascites. IMPRESSION: 1. Cholelithiasis, without associated inflammatory changes to suggest acute cholecystitis. 2. Dilated common bile duct, measuring 10 mm, with associated mild central intrahepatic bile duct dilatation. Consider ERCP or MRCP to assess for choledocholithiasis. Electronically signed by: Pinkie Pebbles MD MD 07/17/2024 11:16 PM EST RP Workstation: [id number]      IMPRESSION AND PLAN:  Assessment and Plan: * Choledocholithiasis - The patient will be admitted to a medical telemetry bed. - This is associated with elevated LFTs - Pain management will be provided. - She will be kept NPO. - GI consult to be obtained. - Dr. Onita was notified about the patient and is aware.  He will be communicating with Dr. Jinny. - She is expected to have ERCP.  Hypokalemia - Will replace potassium and check magnesium   Hypothyroidism - Will continue Synthroid .  GERD without esophagitis - Will continue PPI therapy with IV Protonix  and H2 blocker therapy can be resumed with Pepcid .  She can resume Carafate  as well.   DVT prophylaxis: SCDs.  Medical prophylaxis is being held off till after her procedure. Advanced Care Planning:  Code Status: full code.  Family Communication:  The plan of care was discussed in details with the patient (and family). I answered all questions. The patient agreed to proceed with the above mentioned plan. Further management will depend upon hospital course. Disposition Plan: Back to previous home environment Consults called: GI. All the records are reviewed and case discussed with ED provider.  Status is: Inpatient    At the time of the admission, it appears that the appropriate admission  status for this patient is inpatient.  This is judged to be reasonable and necessary in order to provide the required intensity of service to ensure the patient's safety given the presenting symptoms, physical exam findings and initial radiographic and laboratory data in the context of comorbid conditions.  The patient requires inpatient status due to high intensity of service, high risk of further deterioration and high frequency of surveillance required.  I certify that at the time of admission, it is my clinical judgment that the patient will require inpatient hospital care extending more than 2 midnights.                            Dispo: The patient is from: Home              Anticipated d/c is to: Home              Patient currently is not medically stable to d/c.              Difficult to place patient: No  [place] DELENA Peaches M.D on 07/18/2024 at 7:25 AM  [facility]   From 7 PM-7 AM, contact night-coverage [email or web address]  CC: Primary care physician; Pcp, No     [1] No Known Allergies  "

## 2024-07-18 NOTE — Hospital Course (Addendum)
 Partly taken from prior notes.  Sharon Vaughn is a 24 y.o. female with medical history significant for hypothyroidism, who presented to the emergency room with complaint of right upper quadrant abdominal pain since Monday with associated radiation to her epigastric area as well as recurrent nausea and vomiting.   On presentation borderline soft blood pressure, otherwise stable vitals.  Labs with potassium of 3.4, alkaline phosphatase 216, AST 276, ALT 442, T. bili 2.5.  UA was unremarkable, CBC normal. RUQ ultrasound revealed the followings 1. Cholelithiasis, without associated inflammatory changes to suggest acute cholecystitis. 2. Dilated common bile duct, measuring 10 mm, with associated mild central intrahepatic bile duct dilatation. Consider ERCP or MRCP to assess for choledocholithiasis.  GI and general surgery was consulted.  1/9: MRCP confirmed cholelithiasis without evidence of acute cholecystitis, choledocholithiasis with a 5 mm distal common bile duct stone.  ERCP was suggested.  Unfortunately ERCP was not available at [facility].  We discussed with Dr. Abran at [facility] who was the only provider available to do ERCP, unfortunately he was completely booked and unable to take any further patients and acceptance was declined.  Patient currently hemodynamically stable, mild right upper quadrant discomfort.  Patient do have transaminitis which is consistent with obstruction.  Case was discussed with patient and her husband.  Patient will get benefit from going to a tertiary care center.  Tertiary care center currently not accepting any inpatient transfers due to being on capacity.  Patient and husband requested discharge so they can seek medical attention at a tertiary care center of their choice by themselves.  Patient is being discharged at her request and she will seek medical attention of her choice of tertiary care center.

## 2024-07-18 NOTE — Assessment & Plan Note (Signed)
-   Will continue Synthroid .
